# Patient Record
Sex: Female | Born: 1945 | Race: Black or African American | Hispanic: No | Marital: Married | Smoking: Never smoker
Health system: Southern US, Community
[De-identification: ages and names within clinical notes are randomized; demographics above are authoritative.]

## PROBLEM LIST (undated history)

## (undated) DIAGNOSIS — E079 Disorder of thyroid, unspecified: Secondary | ICD-10-CM

## (undated) DIAGNOSIS — C801 Malignant (primary) neoplasm, unspecified: Secondary | ICD-10-CM

## (undated) DIAGNOSIS — I10 Essential (primary) hypertension: Secondary | ICD-10-CM

## (undated) HISTORY — DX: Malignant (primary) neoplasm, unspecified: C80.1

---

## 1994-04-10 DIAGNOSIS — C801 Malignant (primary) neoplasm, unspecified: Secondary | ICD-10-CM

## 1994-04-10 HISTORY — DX: Malignant (primary) neoplasm, unspecified: C80.1

## 1994-04-10 HISTORY — PX: BREAST SURGERY: SHX581

## 1997-09-28 ENCOUNTER — Ambulatory Visit (HOSPITAL_COMMUNITY): Admission: RE | Admit: 1997-09-28 | Discharge: 1997-09-28 | Payer: Self-pay | Admitting: Specialist

## 1997-10-12 ENCOUNTER — Ambulatory Visit (HOSPITAL_BASED_OUTPATIENT_CLINIC_OR_DEPARTMENT_OTHER): Admission: RE | Admit: 1997-10-12 | Discharge: 1997-10-12 | Payer: Self-pay | Admitting: Specialist

## 1998-10-28 ENCOUNTER — Ambulatory Visit (HOSPITAL_BASED_OUTPATIENT_CLINIC_OR_DEPARTMENT_OTHER): Admission: RE | Admit: 1998-10-28 | Discharge: 1998-10-28 | Payer: Self-pay | Admitting: Specialist

## 2001-02-19 ENCOUNTER — Ambulatory Visit (HOSPITAL_COMMUNITY): Admission: RE | Admit: 2001-02-19 | Discharge: 2001-02-19 | Payer: Self-pay | Admitting: *Deleted

## 2001-02-19 ENCOUNTER — Encounter: Payer: Self-pay | Admitting: *Deleted

## 2001-07-23 ENCOUNTER — Other Ambulatory Visit: Admission: RE | Admit: 2001-07-23 | Discharge: 2001-07-23 | Payer: Self-pay | Admitting: *Deleted

## 2002-03-13 ENCOUNTER — Encounter: Payer: Self-pay | Admitting: *Deleted

## 2002-03-13 ENCOUNTER — Ambulatory Visit (HOSPITAL_COMMUNITY): Admission: RE | Admit: 2002-03-13 | Discharge: 2002-03-13 | Payer: Self-pay | Admitting: *Deleted

## 2003-03-17 ENCOUNTER — Ambulatory Visit (HOSPITAL_COMMUNITY): Admission: RE | Admit: 2003-03-17 | Discharge: 2003-03-17 | Payer: Self-pay | Admitting: *Deleted

## 2004-03-25 ENCOUNTER — Ambulatory Visit (HOSPITAL_COMMUNITY): Admission: RE | Admit: 2004-03-25 | Discharge: 2004-03-25 | Payer: Self-pay | Admitting: *Deleted

## 2004-11-07 ENCOUNTER — Ambulatory Visit (HOSPITAL_COMMUNITY): Admission: RE | Admit: 2004-11-07 | Discharge: 2004-11-07 | Payer: Self-pay | Admitting: Family Medicine

## 2004-11-17 ENCOUNTER — Encounter (HOSPITAL_COMMUNITY): Admission: RE | Admit: 2004-11-17 | Discharge: 2004-12-17 | Payer: Self-pay | Admitting: Family Medicine

## 2004-11-18 ENCOUNTER — Ambulatory Visit: Payer: Self-pay | Admitting: Oncology

## 2004-12-09 ENCOUNTER — Ambulatory Visit: Payer: Self-pay | Admitting: Oncology

## 2006-01-25 ENCOUNTER — Encounter (HOSPITAL_COMMUNITY): Admission: RE | Admit: 2006-01-25 | Discharge: 2006-02-24 | Payer: Self-pay | Admitting: Family Medicine

## 2006-01-29 ENCOUNTER — Ambulatory Visit (HOSPITAL_COMMUNITY): Admission: RE | Admit: 2006-01-29 | Discharge: 2006-01-29 | Payer: Self-pay | Admitting: Family Medicine

## 2006-02-12 ENCOUNTER — Ambulatory Visit (HOSPITAL_COMMUNITY): Admission: RE | Admit: 2006-02-12 | Discharge: 2006-02-12 | Payer: Self-pay | Admitting: General Surgery

## 2006-02-12 ENCOUNTER — Encounter (INDEPENDENT_AMBULATORY_CARE_PROVIDER_SITE_OTHER): Payer: Self-pay | Admitting: *Deleted

## 2006-05-24 ENCOUNTER — Ambulatory Visit (HOSPITAL_COMMUNITY): Admission: RE | Admit: 2006-05-24 | Discharge: 2006-05-24 | Payer: Self-pay | Admitting: Family Medicine

## 2006-07-19 ENCOUNTER — Encounter (HOSPITAL_COMMUNITY): Admission: RE | Admit: 2006-07-19 | Discharge: 2006-08-18 | Payer: Self-pay | Admitting: Family Medicine

## 2006-07-20 ENCOUNTER — Ambulatory Visit (HOSPITAL_COMMUNITY): Admission: RE | Admit: 2006-07-20 | Discharge: 2006-07-20 | Payer: Self-pay | Admitting: Family Medicine

## 2006-07-30 ENCOUNTER — Ambulatory Visit: Payer: Self-pay | Admitting: Oncology

## 2006-08-09 ENCOUNTER — Ambulatory Visit: Payer: Self-pay | Admitting: Oncology

## 2006-09-09 ENCOUNTER — Ambulatory Visit: Payer: Self-pay | Admitting: Oncology

## 2007-02-18 ENCOUNTER — Ambulatory Visit (HOSPITAL_COMMUNITY): Admission: RE | Admit: 2007-02-18 | Discharge: 2007-02-18 | Payer: Self-pay | Admitting: Family Medicine

## 2007-10-22 ENCOUNTER — Ambulatory Visit: Payer: Self-pay | Admitting: Gastroenterology

## 2007-10-30 ENCOUNTER — Ambulatory Visit (HOSPITAL_COMMUNITY): Admission: RE | Admit: 2007-10-30 | Discharge: 2007-10-30 | Payer: Self-pay | Admitting: Gastroenterology

## 2007-10-30 ENCOUNTER — Ambulatory Visit: Payer: Self-pay | Admitting: Gastroenterology

## 2007-10-30 ENCOUNTER — Encounter: Payer: Self-pay | Admitting: Gastroenterology

## 2008-02-19 ENCOUNTER — Ambulatory Visit (HOSPITAL_COMMUNITY): Admission: RE | Admit: 2008-02-19 | Discharge: 2008-02-19 | Payer: Self-pay | Admitting: Family Medicine

## 2008-02-25 ENCOUNTER — Emergency Department (HOSPITAL_COMMUNITY): Admission: EM | Admit: 2008-02-25 | Discharge: 2008-02-25 | Payer: Self-pay | Admitting: Emergency Medicine

## 2008-07-16 ENCOUNTER — Ambulatory Visit (HOSPITAL_COMMUNITY): Admission: RE | Admit: 2008-07-16 | Discharge: 2008-07-16 | Payer: Self-pay | Admitting: Family Medicine

## 2008-08-08 ENCOUNTER — Ambulatory Visit: Payer: Self-pay | Admitting: Oncology

## 2008-08-10 ENCOUNTER — Ambulatory Visit: Payer: Self-pay | Admitting: Oncology

## 2008-09-08 ENCOUNTER — Ambulatory Visit: Payer: Self-pay | Admitting: Oncology

## 2008-09-09 ENCOUNTER — Ambulatory Visit: Payer: Self-pay | Admitting: Oncology

## 2008-09-18 ENCOUNTER — Ambulatory Visit: Payer: Self-pay | Admitting: Oncology

## 2008-09-21 ENCOUNTER — Ambulatory Visit: Payer: Self-pay | Admitting: Oncology

## 2008-10-01 ENCOUNTER — Ambulatory Visit: Payer: Self-pay | Admitting: General Surgery

## 2008-10-07 ENCOUNTER — Inpatient Hospital Stay: Payer: Self-pay | Admitting: General Surgery

## 2008-10-08 ENCOUNTER — Ambulatory Visit: Payer: Self-pay | Admitting: Oncology

## 2008-10-23 ENCOUNTER — Ambulatory Visit: Payer: Self-pay | Admitting: Oncology

## 2008-11-08 ENCOUNTER — Ambulatory Visit: Payer: Self-pay | Admitting: Oncology

## 2008-11-16 ENCOUNTER — Ambulatory Visit (HOSPITAL_COMMUNITY): Admission: RE | Admit: 2008-11-16 | Discharge: 2008-11-16 | Payer: Self-pay | Admitting: Family Medicine

## 2008-12-09 ENCOUNTER — Ambulatory Visit: Payer: Self-pay | Admitting: Oncology

## 2009-01-08 ENCOUNTER — Ambulatory Visit: Payer: Self-pay | Admitting: Oncology

## 2009-02-08 ENCOUNTER — Ambulatory Visit: Payer: Self-pay | Admitting: Oncology

## 2009-02-24 ENCOUNTER — Ambulatory Visit (HOSPITAL_COMMUNITY): Admission: RE | Admit: 2009-02-24 | Discharge: 2009-02-24 | Payer: Self-pay | Admitting: Family Medicine

## 2009-03-01 ENCOUNTER — Ambulatory Visit: Payer: Self-pay | Admitting: Oncology

## 2009-03-10 ENCOUNTER — Ambulatory Visit: Payer: Self-pay | Admitting: Oncology

## 2009-04-10 ENCOUNTER — Ambulatory Visit: Payer: Self-pay | Admitting: Oncology

## 2009-05-11 ENCOUNTER — Ambulatory Visit: Payer: Self-pay | Admitting: Nurse Practitioner

## 2009-05-11 ENCOUNTER — Ambulatory Visit: Payer: Self-pay | Admitting: Oncology

## 2009-06-08 ENCOUNTER — Ambulatory Visit: Payer: Self-pay | Admitting: Nurse Practitioner

## 2009-06-08 ENCOUNTER — Ambulatory Visit: Payer: Self-pay | Admitting: Oncology

## 2009-06-30 ENCOUNTER — Ambulatory Visit (HOSPITAL_COMMUNITY)
Admission: RE | Admit: 2009-06-30 | Discharge: 2009-06-30 | Payer: Self-pay | Source: Home / Self Care | Admitting: Family Medicine

## 2009-07-09 ENCOUNTER — Ambulatory Visit: Payer: Self-pay | Admitting: Nurse Practitioner

## 2009-07-09 ENCOUNTER — Ambulatory Visit: Payer: Self-pay | Admitting: Oncology

## 2009-08-08 ENCOUNTER — Ambulatory Visit: Payer: Self-pay | Admitting: Nurse Practitioner

## 2009-08-08 ENCOUNTER — Ambulatory Visit: Payer: Self-pay | Admitting: Oncology

## 2009-09-08 ENCOUNTER — Ambulatory Visit: Payer: Self-pay | Admitting: Nurse Practitioner

## 2009-09-08 ENCOUNTER — Ambulatory Visit: Payer: Self-pay | Admitting: Oncology

## 2009-10-08 ENCOUNTER — Ambulatory Visit: Payer: Self-pay | Admitting: Nurse Practitioner

## 2009-10-08 ENCOUNTER — Ambulatory Visit: Payer: Self-pay | Admitting: Oncology

## 2009-11-02 LAB — CANCER ANTIGEN 27.29: CA 27.29: 172.7 U/mL — ABNORMAL HIGH (ref 0.0–38.6)

## 2009-11-08 ENCOUNTER — Ambulatory Visit: Payer: Self-pay | Admitting: Oncology

## 2009-11-29 ENCOUNTER — Ambulatory Visit: Payer: Self-pay | Admitting: Oncology

## 2009-12-01 LAB — CANCER ANTIGEN 27.29: CA 27.29: 176.7 U/mL — ABNORMAL HIGH (ref 0.0–38.6)

## 2009-12-09 ENCOUNTER — Ambulatory Visit: Payer: Self-pay | Admitting: Oncology

## 2010-01-08 ENCOUNTER — Ambulatory Visit: Payer: Self-pay | Admitting: Oncology

## 2010-02-08 ENCOUNTER — Ambulatory Visit: Payer: Self-pay | Admitting: Oncology

## 2010-03-10 ENCOUNTER — Ambulatory Visit: Payer: Self-pay | Admitting: Oncology

## 2010-03-22 ENCOUNTER — Ambulatory Visit (HOSPITAL_COMMUNITY)
Admission: RE | Admit: 2010-03-22 | Discharge: 2010-03-22 | Payer: Self-pay | Source: Home / Self Care | Attending: Oncology | Admitting: Oncology

## 2010-03-29 LAB — CANCER ANTIGEN 27.29: CA 27.29: 185.4 U/mL — ABNORMAL HIGH (ref 0.0–38.6)

## 2010-04-06 ENCOUNTER — Ambulatory Visit: Payer: Self-pay | Admitting: Oncology

## 2010-04-10 ENCOUNTER — Ambulatory Visit: Payer: Self-pay | Admitting: Oncology

## 2010-04-26 LAB — CANCER ANTIGEN 27.29: CA 27.29: 200.7 U/mL — ABNORMAL HIGH (ref 0.0–38.6)

## 2010-05-01 ENCOUNTER — Encounter: Payer: Self-pay | Admitting: Family Medicine

## 2010-05-11 ENCOUNTER — Ambulatory Visit: Payer: Self-pay | Admitting: Oncology

## 2010-06-09 ENCOUNTER — Ambulatory Visit: Payer: Self-pay | Admitting: Oncology

## 2010-07-10 ENCOUNTER — Ambulatory Visit: Payer: Self-pay | Admitting: Oncology

## 2010-08-09 ENCOUNTER — Ambulatory Visit: Payer: Self-pay | Admitting: Oncology

## 2010-08-23 NOTE — Consult Note (Signed)
NAMEESTEL, TONELLI              ACCOUNT NO.:  [id number]   MEDICAL RECORD NO.:  [id number]          PATIENT TYPE:  AMB   LOCATION:  DAY                           FACILITY:  [facility]   PHYSICIAN:  Kassie Mends, M.D.      DATE OF BIRTH:  09-10-1945   DATE OF CONSULTATION:  10/22/2007  DATE OF DISCHARGE:                                 CONSULTATION   REASON FOR CONSULTATION:  Abdominal pain, bloating, gas, and dark  stools.   PHYSICIAN REQUESTING CONSULTATION:  Melony Overly, PAC with Patrica Duel, MD   PHYSICIAN CONCERNING NOTE:  Kassie Mends, MD   HISTORY OF PRESENT ILLNESS:  Toni Harper is a very pleasant 65 year old  African American female, who presents today at the request of Melony Overly, [facility], with Dr. Patrica Duel, for further evaluation of abdominal  pain, bloating, gas, and dark stools.  The patient states over the last  couple of months, she has been having postprandial abdominal pain.  She  states that the pain is so severe, she typically avoid meals as much as  possible.  She notes that she fast all day long; however, she then  develops abdominal pain.  She states that it has been gas like pains.  She has a lot of rolling on her stomach.  Pain is generally in the upper  abdomen.  She also has heartburn and indigestion.  She has it worse at  nighttime, especially if she eats light.  She often can only lie down  because of severe pain.  She has tried antacids, over-the-counter Pepto-  Bismol.  She has recently started on Nexium for the past 2 weeks.  She  has had some modest improvement in her symptoms.  Her bowel movements  are regular.  No bright red blood per rectum.  She has never had an EGD  or colonoscopy.   CURRENT MEDICATIONS:  1. Levothyroxine 125 mcg daily.  2. Simvastatin 10 mg daily.  3. Nexium 40 mg daily.  4. Milk of magnesia p.r.n.  5. Antacids p.r.n.   ALLERGIES:  No known drug allergies   PAST MEDICAL HISTORY:  1. She had right breast cancer in  1995, status post mastectomy and      chemo and radiation therapy.  2. Hypercholesterolemia.  3. Gastroesophageal reflux disease.  4. She has thyroid surgery and is on oral supplements.  5. She had 2 cesarean sections.  6. Cholecystectomy back in 2007, did not have any gallstones at that      time.  7. Per Dr. Geanie Logan medical records, she had H. pylori and on      February 7, and was treated but the patient does not recall this.   FAMILY HISTORY:  Mother died at age 84 with cancer of the gallbladder.  Father died at age 45 with cirrhosis of liver.  No family history for  colorectal cancer.   SOCIAL HISTORY:  She is married.  She has 2 sons.  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  She is a  nonsmoker.  No  alcohol use.   REVIEW OF SYSTEMS:  See HPI for GI.  CONSTITUTIONAL:  No weight loss.  CARDIOPULMONARY:  No chest pain or shortness of breath.  GENITOURINARY:  No dysuria or hematuria.   PHYSICAL EXAMINATION:  VITAL SIGNS:  Weight 181, height 5 feet 6 inches,  temp 99.4, blood pressure 122/78, and pulse 80.  GENERAL:  Pleasant, well-nourished, and well-developed black female in  no acute distress.  SKIN: Warm and dry. No jaundice.  HEENT:  Sclera nonicteric.  Oropharyngeal mucosa moist and pink.  No  lesion, erythema, or exudates.  No lymphadenopathy or thyromegaly.  CHEST:  Lungs are clear to auscultation.  CARDIAC:  Regular rate and rhythm.  Normal S1 and S2.  No murmurs, rubs,  or gallops.  ABDOMEN:  Positive bowel sounds.  Abdomen soft and nondistended.  She  has mildly epigastric tenderness to deep palpation.  No rebound or  guarding.  No organomegaly or masses.  No abdominal bruits or hernia.  EXTREMITIES:  No edema.   IMPRESSION:  Ms. Greenspan is a pleasant 65 year old lady with a couple  of months of history postprandial upper abdominal pain associated with  bloating and gas, suspected dark stools due to Pepto-Bismol.  She also   has poorly controlled reflux with particularly not tunnel symptoms.  She  is on Nexium 2 weeks with some improvement of these symptoms.  She may  have gastritis, peptic ulcer disease.  She may simply have a complicated  gastroesophageal reflux disease.  She also has never had a colonoscopy.   PLAN:  1. EGD and colonoscopy by Dr. Cira Servant in the near future.  I discussed      risks, alternatives, and benefits with regards to the risks and      reactions of medication, perforation, infection, or bleeding and      she is in agreement of her procedures.  2. She will continue Nexium 40 mg daily as before.  3. Further recommendations to follow.   I would like to thank Dr. Marlan Palau for allowing [place] to take part in  the care of this patient.      Tana Coast, P.A.      Kassie Mends, M.D.  Electronically Signed    LL/MEDQ  D:  10/22/2007  T:  10/23/2007  Job:  [id number]   cc:   Patrica Duel, M.D.  Fax: [phone]   Kassie Mends, M.D.  [address]  [place] , [place] [address]

## 2010-08-23 NOTE — Op Note (Signed)
NAMEDELORA, Toni Harper              ACCOUNT NO.:  [id number]   MEDICAL RECORD NO.:  [id number]          PATIENT TYPE:  AMB   LOCATION:  DAY                           FACILITY:  [facility]   PHYSICIAN:  Kassie Mends, M.D.      DATE OF BIRTH:  1946/01/18   DATE OF PROCEDURE:  10/30/2007  DATE OF DISCHARGE:                               OPERATIVE REPORT   REFERRING PHYSICIAN:  Patrica Duel, MD   PROCEDURES:  1. Colonoscopy.  2. Esophagogastroduodenoscopy with cold forceps biopsy.   INDICATION FOR EXAM:  [name] is a 65 year old female who complains  of postprandial abdominal pain, the onset is immediate.  She denies any  nausea, vomiting, diarrhea, or constipation.  She does have gas.  She is  on Nexium as an outpatient.  She denies any rectal bleeding.  She  reportedly had a history of positive H. pylori serology, but denies  being treated.   FINDINGS:  1. Moderate internal hemorrhoids.  Otherwise, no polyps, masses,      inflammatory changes, diverticula, or AVMs.  2. Normal esophagus without evidence of Barrett's, mass, erosion,      ulceration, or stricture.  3. Patchy erythema in the body and antrum without erosion or      ulceration.  Biopsies obtained via cold forceps to evaluate for H.      pylori gastritis.  4. Normal duodenal bulb, ampulla, and second portion of the duodenum.   DIAGNOSIS:  [name] postprandial abdominal pain is likely  secondary to gastritis. The differential diagnosis includes a functional  gut disorder.   RECOMMENDATIONS:  1. Screening colonoscopy in 10 years.  2. Will call her with the results of her biopsies.  If her biopsy      showed no evidence of H. pylori gastritis and then would consider      treating based on her H. pylori serology and consider adding Levsin      prior to meals.  3. She should follow a high-fiber diet.  She is given a handout on      high-fiber diet, hemorrhoids, and gastritis.  4. Follow up appointment in 6 weeks  with Tana Coast regarding her      abdominal pain.   PROCEDURE TECHNIQUE:  Physical exam was performed.  Informed consent was  obtained from the patient after explaining the benefits, risks, and  alternatives to the procedure.  The patient was connected to the monitor  and placed in left lateral position.  Continuous oxygen was provided by  nasal cannula.  IV medicine was administered through an indwelling  cannula.  After administration of sedation and rectal exam, the  patient's rectum was intubated, and the scope was advanced under direct  visualization to the cecum.  The scope was removed slowly by carefully  examining the color, texture, anatomy, and integrity of the mucosa on  the way out.   After the colonoscopy, the patient's esophagus was intubated with a  diagnostic gastroscope.  The scope was advanced under direct  visualization to the second portion of the duodenum.  The scope was  removed slowly  by careful examining the color, texture, anatomy, and  integrity of the mucosa on the way out.  The patient was recovered in  Endoscopy and discharged home in satisfactory condition.   PATH:  H. pylori gastritis. ZO:XWRU/EAVWU for 7 days.      Kassie Mends, M.D.  Electronically Signed     SM/MEDQ  D:  10/30/2007  T:  10/30/2007  Job:  [id number]   cc:   Patrica Duel, M.D.  Fax: [phone]

## 2010-08-26 NOTE — H&P (Signed)
NAMETEAGEN, BUCIO NO.:  [id number]   MEDICAL RECORD NO.:  [id number]          PATIENT TYPE:  AMB   LOCATION:  DAY                           FACILITY:  [facility]   PHYSICIAN:  Dalia Heading, M.D.  DATE OF BIRTH:  28-Oct-1945   DATE OF ADMISSION:  02/12/2006  DATE OF DISCHARGE:  LH                                HISTORY & PHYSICAL   CHIEF COMPLAINT:  Cholecystitis, cholelithiasis.   HISTORY OF PRESENT ILLNESS:  The patient is a 65 year old black female who  is referred for evaluation and treatment of biliary colic secondary to  cholelithiasis.  She has been having right upper quadrant abdominal pain,  nausea, bloating for many weeks.  She does have fatty food intolerance.  No  fever, chills, or jaundice have been noted.   PAST MEDICAL HISTORY:  Hypothyroidism.   PAST SURGICAL HISTORY:  1. Right breast biopsy.  2. Thyroidectomy.  3. C-sections.   CURRENT MEDICATIONS:  Synthroid 1 tablet p.o. daily.   ALLERGIES:  No known drug allergies.   REVIEW OF SYSTEMS:  Noncontributory.   PHYSICAL EXAMINATION:  GENERAL:  The patient is a well-developed, well-  nourished black female in no acute distress.  HEENT:  Reveals no scleral icterus.  LUNGS:  Clear to auscultation, with equal breath sounds bilaterally.  HEART:  Reveals a regular rate and rhythm, without S3, S4, or murmurs.  ABDOMEN:  Soft and nondistended, with mild tenderness in the right upper  quadrant to palpation.  No hepatosplenomegaly, masses, or hernias are  identified.   Ultrasound of the gallbladder reveals cholelithiasis, with a normal common  bile duct.  HIDA scan reveals chronic cholecystitis, with a low gallbladder  ejection fraction.   IMPRESSION:  1. Cholecystitis.  2. Cholelithiasis.   PLAN:  The patient is scheduled for a laparoscopic cholecystectomy on  February 12, 2006.  The risks and benefits of the procedure, including  bleeding, infection, hepatobiliary injury, and the possibly of  an open  procedure were fully explained to the patient, who gave informed consent.      Dalia Heading, M.D.  Electronically Signed     MAJ/MEDQ  D:  02/08/2006  T:  02/08/2006  Job:  [id number]   cc:   Short Stay at [facility]   Patrica Duel, M.D.  Fax: [phone]

## 2010-08-26 NOTE — Op Note (Signed)
NAMEJOHNISHA, LOUKS NO.:  [id number]   MEDICAL RECORD NO.:  [id number]          PATIENT TYPE:  AMB   LOCATION:  DAY                           FACILITY:  [facility]   PHYSICIAN:  Dalia Heading, M.D.  DATE OF BIRTH:  11/14/1945   DATE OF PROCEDURE:  02/12/2006  DATE OF DISCHARGE:                                 OPERATIVE REPORT   PREOPERATIVE DIAGNOSIS:  Cholecystitis, cholelithiasis.   POSTOPERATIVE DIAGNOSIS:  Cholecystitis, cholelithiasis.   PROCEDURE:  Laparoscopic cholecystectomy.   SURGEON:  Dr. Franky Macho.   ASSISTANT:  Dr. Arna Snipe.   ANESTHESIA:  General endotracheal.   INDICATIONS:  The patient is a 65 year old black female presents with  cholecystitis secondary to cholelithiasis.  Risks and benefits of the  procedure including bleeding, infection, hepatobiliary injury, possibility  of an open procedure were fully explained to the patient, who gave informed  consent.   PROCEDURE NOTE:  The patient was placed in the supine position.  After  induction of general endotracheal anesthesia, the abdomen prepped and draped  in the usual sterile technique with Betadine.  Surgical site confirmation  was performed.   A supraumbilical incision was made down to the fascia.  Veress needle was  introduced into the abdominal cavity and confirmation of placement was done  using the saline drop test.  The abdomen was then insufflated to 16 mmHg  pressure.  11 mm trocar was introduced into the abdominal cavity under  direct visualization without difficulty.  The patient was placed in reversed  Trendelenburg position.  An additional 11-mm trocar was placed in the  epigastric region and 5-mm trocars placed in the right upper quadrant right  flank regions.  Liver was inspected and noted to be normal limits.  The  gallbladder was retracted superior laterally.  Dissection was begun around  the infundibulum of the gallbladder.  The cystic duct was first  identified.  Its juncture to the infundibulum fully identified.  Endo clips placed  proximally distally on cystic duct and cystic duct was divided.  This  likewise done to the cystic artery.  The gallbladder then freed away from  the gallbladder fossa using Bovie electrocautery.  The gallbladder delivered  through the epigastric trocar site using EndoCatch bag.  The gallbladder  fossa was inspected and no abnormal bleeding or bile leakage was noted.  Surgicel was placed in the gallbladder fossa.  All fluid and then evacuate  from the abdominal cavity prior to remove the trocars.   All wounds were irrigated normal saline.  All wounds were injected with 0.5  cm Sensorcaine.  The supraumbilical fascia was reapproximated using an O  Vicryl interrupted suture.  All skin incisions were closed using staples.  Betadine ointment and dry sterile dressings were applied.   All tape and needle counts correct end the procedure.  The patient was  extubated in the operating room went back to recovery room awake in stable  condition.   COMPLICATIONS:  None.   SPECIMEN:  Gallbladder with stones.   BLOOD LOSS:  Minimal.      Dalia Heading, M.D.  Electronically Signed     MAJ/MEDQ  D:  02/12/2006  T:  02/12/2006  Job:  [id number]   cc:   Patrica Duel, M.D.  Fax: [phone]

## 2010-09-09 ENCOUNTER — Ambulatory Visit: Payer: Self-pay | Admitting: Oncology

## 2010-10-09 ENCOUNTER — Ambulatory Visit: Payer: Self-pay | Admitting: Oncology

## 2010-10-11 LAB — CANCER ANTIGEN 27.29: CA 27.29: 205.4 U/mL — ABNORMAL HIGH (ref 0.0–38.6)

## 2010-11-09 ENCOUNTER — Ambulatory Visit: Payer: Self-pay | Admitting: Oncology

## 2010-12-10 ENCOUNTER — Ambulatory Visit: Payer: Self-pay | Admitting: Oncology

## 2011-01-09 ENCOUNTER — Ambulatory Visit: Payer: Self-pay | Admitting: Oncology

## 2011-02-09 ENCOUNTER — Ambulatory Visit: Payer: Self-pay | Admitting: Oncology

## 2011-03-11 ENCOUNTER — Ambulatory Visit: Payer: Self-pay | Admitting: Oncology

## 2011-04-11 ENCOUNTER — Ambulatory Visit: Payer: Self-pay | Admitting: Oncology

## 2011-05-12 ENCOUNTER — Other Ambulatory Visit (HOSPITAL_COMMUNITY): Payer: Self-pay | Admitting: Internal Medicine

## 2011-05-12 DIAGNOSIS — Z139 Encounter for screening, unspecified: Secondary | ICD-10-CM

## 2011-05-15 ENCOUNTER — Ambulatory Visit: Payer: Self-pay | Admitting: Oncology

## 2011-05-15 LAB — CBC CANCER CENTER
Basophil #: 0 x10 3/mm (ref 0.0–0.1)
Basophil %: 0 %
Eosinophil #: 0.2 x10 3/mm (ref 0.0–0.7)
Eosinophil %: 5.3 %
HGB: 11.1 g/dL — ABNORMAL LOW (ref 12.0–16.0)
Lymphocyte #: 0.7 x10 3/mm — ABNORMAL LOW (ref 1.0–3.6)
MCH: 28.9 pg (ref 26.0–34.0)
MCHC: 33.7 g/dL (ref 32.0–36.0)
MCV: 86 fL (ref 80–100)
Monocyte #: 0.2 x10 3/mm (ref 0.0–0.7)
Neutrophil %: 76.4 %
Platelet: 270 x10 3/mm (ref 150–440)
RDW: 13.7 % (ref 11.5–14.5)

## 2011-05-15 LAB — COMPREHENSIVE METABOLIC PANEL
Albumin: 4.1 g/dL (ref 3.4–5.0)
Alkaline Phosphatase: 88 U/L (ref 50–136)
Anion Gap: 11 (ref 7–16)
BUN: 14 mg/dL (ref 7–18)
Bilirubin,Total: 0.5 mg/dL (ref 0.2–1.0)
Calcium, Total: 8.9 mg/dL (ref 8.5–10.1)
Chloride: 99 mmol/L (ref 98–107)
Co2: 28 mmol/L (ref 21–32)
EGFR (African American): >60
Glucose: 111 mg/dL — ABNORMAL HIGH (ref 65–99)
Osmolality: 277 (ref 275–301)
Potassium: 3.6 mmol/L (ref 3.5–5.1)
Sodium: 138 mmol/L (ref 136–145)

## 2011-05-16 ENCOUNTER — Ambulatory Visit (HOSPITAL_COMMUNITY): Payer: Self-pay

## 2011-05-16 LAB — CANCER ANTIGEN 27.29: CA 27.29: 208.2 U/mL — ABNORMAL HIGH (ref 0.0–38.6)

## 2011-06-09 ENCOUNTER — Ambulatory Visit: Payer: Self-pay | Admitting: Oncology

## 2011-06-12 LAB — CBC CANCER CENTER
Basophil #: 0 x10 3/mm (ref 0.0–0.1)
Basophil %: 0.9 %
Eosinophil %: 5 %
Lymphocyte #: 1 x10 3/mm (ref 1.0–3.6)
Lymphocyte %: 22.6 %
Monocyte %: 9 %
Neutrophil #: 2.8 x10 3/mm (ref 1.4–6.5)
Platelet: 238 x10 3/mm (ref 150–440)
WBC: 4.5 x10 3/mm (ref 3.6–11.0)

## 2011-06-12 LAB — COMPREHENSIVE METABOLIC PANEL
Albumin: 3.5 g/dL (ref 3.4–5.0)
Alkaline Phosphatase: 86 U/L (ref 50–136)
Anion Gap: 13 (ref 7–16)
BUN: 22 mg/dL — ABNORMAL HIGH (ref 7–18)
Bilirubin,Total: 0.4 mg/dL (ref 0.2–1.0)
Chloride: 103 mmol/L (ref 98–107)
Creatinine: 1.04 mg/dL (ref 0.60–1.30)
EGFR (African American): >60
EGFR (Non-African Amer.): 57 — ABNORMAL LOW
Osmolality: 290 (ref 275–301)
SGPT (ALT): 33 U/L
Sodium: 141 mmol/L (ref 136–145)

## 2011-06-22 ENCOUNTER — Other Ambulatory Visit: Payer: Self-pay | Admitting: Internal Medicine

## 2011-06-22 ENCOUNTER — Other Ambulatory Visit (HOSPITAL_COMMUNITY): Payer: Self-pay | Admitting: Oncology

## 2011-06-22 DIAGNOSIS — Z139 Encounter for screening, unspecified: Secondary | ICD-10-CM

## 2011-06-26 ENCOUNTER — Ambulatory Visit (HOSPITAL_COMMUNITY)
Admission: RE | Admit: 2011-06-26 | Discharge: 2011-06-26 | Disposition: A | Discharge status: Home / Self Care | Payer: Managed Care, Other (non HMO) | Source: Ambulatory Visit | Attending: Oncology | Admitting: Oncology

## 2011-06-26 DIAGNOSIS — Z1231 Encounter for screening mammogram for malignant neoplasm of breast: Secondary | ICD-10-CM | POA: Insufficient documentation

## 2011-06-26 DIAGNOSIS — Z139 Encounter for screening, unspecified: Secondary | ICD-10-CM

## 2011-07-10 ENCOUNTER — Ambulatory Visit: Payer: Self-pay | Admitting: Oncology

## 2011-07-10 LAB — COMPREHENSIVE METABOLIC PANEL
Alkaline Phosphatase: 73 U/L (ref 50–136)
Calcium, Total: 9.1 mg/dL (ref 8.5–10.1)
Co2: 28 mmol/L (ref 21–32)
Creatinine: 0.91 mg/dL (ref 0.60–1.30)
Glucose: 155 mg/dL — ABNORMAL HIGH (ref 65–99)
Potassium: 3.3 mmol/L — ABNORMAL LOW (ref 3.5–5.1)
Sodium: 143 mmol/L (ref 136–145)
Total Protein: 9.2 g/dL — ABNORMAL HIGH (ref 6.4–8.2)

## 2011-07-10 LAB — CBC CANCER CENTER
Basophil %: 1.2 %
Eosinophil %: 3.3 %
HCT: 31.3 % — ABNORMAL LOW (ref 35.0–47.0)
Lymphocyte %: 20.9 %
MCHC: 33.7 g/dL (ref 32.0–36.0)
MCV: 86 fL (ref 80–100)
Monocyte %: 7.1 %
Neutrophil #: 3.7 x10 3/mm (ref 1.4–6.5)
Neutrophil %: 67.5 %
Platelet: 231 x10 3/mm (ref 150–440)
RBC: 3.65 10*6/uL — ABNORMAL LOW (ref 3.80–5.20)

## 2011-07-11 LAB — CANCER ANTIGEN 27.29: CA 27.29: 207.8 U/mL — ABNORMAL HIGH (ref 0.0–38.6)

## 2011-08-09 ENCOUNTER — Ambulatory Visit: Payer: Self-pay | Admitting: Oncology

## 2011-08-14 LAB — COMPREHENSIVE METABOLIC PANEL
Anion Gap: 13 (ref 7–16)
Bilirubin,Total: 0.5 mg/dL (ref 0.2–1.0)
Calcium, Total: 8.8 mg/dL (ref 8.5–10.1)
Co2: 26 mmol/L (ref 21–32)
Creatinine: 1 mg/dL (ref 0.60–1.30)
EGFR (African American): >60
EGFR (Non-African Amer.): 60 — ABNORMAL LOW
Osmolality: 286 (ref 275–301)
Potassium: 3.4 mmol/L — ABNORMAL LOW (ref 3.5–5.1)
SGOT(AST): 24 U/L (ref 15–37)
SGPT (ALT): 26 U/L
Sodium: 141 mmol/L (ref 136–145)
Total Protein: 8.2 g/dL (ref 6.4–8.2)

## 2011-08-14 LAB — CBC CANCER CENTER
Basophil #: 0.1 x10 3/mm (ref 0.0–0.1)
Eosinophil #: 0.3 x10 3/mm (ref 0.0–0.7)
Eosinophil %: 4.9 %
HCT: 35.8 % (ref 35.0–47.0)
Lymphocyte #: 1.3 x10 3/mm (ref 1.0–3.6)
Lymphocyte %: 23.2 %
MCH: 28.5 pg (ref 26.0–34.0)
Monocyte #: 0.5 x10 3/mm (ref 0.2–0.9)
Neutrophil %: 61.5 %
Platelet: 197 x10 3/mm (ref 150–440)
WBC: 5.8 x10 3/mm (ref 3.6–11.0)

## 2011-08-15 LAB — CANCER ANTIGEN 27.29: CA 27.29: 211.2 U/mL — ABNORMAL HIGH (ref 0.0–38.6)

## 2011-09-09 ENCOUNTER — Ambulatory Visit: Payer: Self-pay | Admitting: Oncology

## 2011-09-11 LAB — COMPREHENSIVE METABOLIC PANEL
Alkaline Phosphatase: 92 U/L (ref 50–136)
Anion Gap: 12 (ref 7–16)
BUN: 20 mg/dL — ABNORMAL HIGH (ref 7–18)
Calcium, Total: 9.3 mg/dL (ref 8.5–10.1)
Chloride: 103 mmol/L (ref 98–107)
Creatinine: 0.96 mg/dL (ref 0.60–1.30)
EGFR (African American): >60
Osmolality: 282 (ref 275–301)
Potassium: 3.5 mmol/L (ref 3.5–5.1)
SGOT(AST): 28 U/L (ref 15–37)
Sodium: 140 mmol/L (ref 136–145)
Total Protein: 9.3 g/dL — ABNORMAL HIGH (ref 6.4–8.2)

## 2011-09-11 LAB — CBC CANCER CENTER
Basophil #: 0.1 x10 3/mm (ref 0.0–0.1)
Basophil %: 1.1 %
Eosinophil #: 0.4 x10 3/mm (ref 0.0–0.7)
Eosinophil %: 6.4 %
HCT: 32.5 % — ABNORMAL LOW (ref 35.0–47.0)
Lymphocyte %: 23.4 %
MCH: 28.3 pg (ref 26.0–34.0)
MCHC: 32.6 g/dL (ref 32.0–36.0)
MCV: 87 fL (ref 80–100)
Platelet: 250 x10 3/mm (ref 150–440)
RDW: 13.5 % (ref 11.5–14.5)

## 2011-09-12 LAB — CANCER ANTIGEN 27.29: CA 27.29: 183.5 U/mL — ABNORMAL HIGH (ref 0.0–38.6)

## 2011-10-09 ENCOUNTER — Ambulatory Visit: Payer: Self-pay | Admitting: Oncology

## 2011-10-09 LAB — COMPREHENSIVE METABOLIC PANEL
Alkaline Phosphatase: 82 U/L (ref 50–136)
Anion Gap: 10 (ref 7–16)
BUN: 12 mg/dL (ref 7–18)
Chloride: 106 mmol/L (ref 98–107)
Creatinine: 0.81 mg/dL (ref 0.60–1.30)
EGFR (Non-African Amer.): >60
Glucose: 130 mg/dL — ABNORMAL HIGH (ref 65–99)
Osmolality: 286 (ref 275–301)
SGOT(AST): 19 U/L (ref 15–37)
SGPT (ALT): 23 U/L
Total Protein: 8.5 g/dL — ABNORMAL HIGH (ref 6.4–8.2)

## 2011-10-09 LAB — CBC CANCER CENTER
Basophil #: 0.1 x10 3/mm (ref 0.0–0.1)
Eosinophil #: 0.3 x10 3/mm (ref 0.0–0.7)
Eosinophil %: 6.3 %
HCT: 34 % — ABNORMAL LOW (ref 35.0–47.0)
HGB: 11 g/dL — ABNORMAL LOW (ref 12.0–16.0)
MCH: 28.5 pg (ref 26.0–34.0)
MCHC: 32.5 g/dL (ref 32.0–36.0)
MCV: 88 fL (ref 80–100)
Monocyte %: 9.1 %
Neutrophil %: 57.3 %
Platelet: 245 x10 3/mm (ref 150–440)
RBC: 3.87 10*6/uL (ref 3.80–5.20)
WBC: 4.8 x10 3/mm (ref 3.6–11.0)

## 2011-11-06 LAB — CBC CANCER CENTER
Basophil #: 0.1 x10 3/mm (ref 0.0–0.1)
Basophil %: 1.2 %
Eosinophil #: 0.5 x10 3/mm (ref 0.0–0.7)
Lymphocyte #: 1.3 x10 3/mm (ref 1.0–3.6)
MCH: 29 pg (ref 26.0–34.0)
MCHC: 33.3 g/dL (ref 32.0–36.0)
MCV: 87 fL (ref 80–100)
Monocyte #: 0.4 x10 3/mm (ref 0.2–0.9)
Neutrophil #: 3.1 x10 3/mm (ref 1.4–6.5)
Neutrophil %: 56.8 %
Platelet: 266 x10 3/mm (ref 150–440)
RBC: 3.85 10*6/uL (ref 3.80–5.20)
RDW: 13.4 % (ref 11.5–14.5)

## 2011-11-06 LAB — COMPREHENSIVE METABOLIC PANEL
Anion Gap: 12 (ref 7–16)
BUN: 17 mg/dL (ref 7–18)
Calcium, Total: 8.9 mg/dL (ref 8.5–10.1)
Chloride: 104 mmol/L (ref 98–107)
Co2: 25 mmol/L (ref 21–32)
Potassium: 3.9 mmol/L (ref 3.5–5.1)
SGOT(AST): 25 U/L (ref 15–37)
SGPT (ALT): 27 U/L
Total Protein: 8.8 g/dL — ABNORMAL HIGH (ref 6.4–8.2)

## 2011-11-09 ENCOUNTER — Ambulatory Visit: Payer: Self-pay | Admitting: Oncology

## 2011-12-04 LAB — CBC CANCER CENTER
Basophil #: 0 x10 3/mm (ref 0.0–0.1)
Basophil %: 0.6 %
Eosinophil #: 0.4 x10 3/mm (ref 0.0–0.7)
Eosinophil %: 8.4 %
HCT: 33.5 % — ABNORMAL LOW (ref 35.0–47.0)
HGB: 10.9 g/dL — ABNORMAL LOW (ref 12.0–16.0)
Lymphocyte #: 1.2 x10 3/mm (ref 1.0–3.6)
Lymphocyte %: 22.6 %
MCHC: 32.5 g/dL (ref 32.0–36.0)
MCV: 86 fL (ref 80–100)
Monocyte %: 14.1 %
Neutrophil #: 2.8 x10 3/mm (ref 1.4–6.5)
Neutrophil %: 54.3 %
RBC: 3.89 10*6/uL (ref 3.80–5.20)
WBC: 5.2 x10 3/mm (ref 3.6–11.0)

## 2011-12-04 LAB — BASIC METABOLIC PANEL
Anion Gap: 11 (ref 7–16)
BUN: 18 mg/dL (ref 7–18)
Chloride: 103 mmol/L (ref 98–107)
Creatinine: 1.04 mg/dL (ref 0.60–1.30)
EGFR (Non-African Amer.): 57 — ABNORMAL LOW
Potassium: 3.7 mmol/L (ref 3.5–5.1)

## 2011-12-05 LAB — CANCER ANTIGEN 27.29: CA 27.29: 196 U/mL — ABNORMAL HIGH (ref 0.0–38.6)

## 2011-12-10 ENCOUNTER — Ambulatory Visit: Payer: Self-pay | Admitting: Oncology

## 2012-01-01 LAB — BASIC METABOLIC PANEL
Calcium, Total: 8.9 mg/dL (ref 8.5–10.1)
Creatinine: 0.8 mg/dL (ref 0.60–1.30)
EGFR (African American): >60
EGFR (Non-African Amer.): >60
Glucose: 101 mg/dL — ABNORMAL HIGH (ref 65–99)
Potassium: 3.5 mmol/L (ref 3.5–5.1)
Sodium: 138 mmol/L (ref 136–145)

## 2012-01-09 ENCOUNTER — Ambulatory Visit: Payer: Self-pay | Admitting: Oncology

## 2012-01-29 LAB — COMPREHENSIVE METABOLIC PANEL
Alkaline Phosphatase: 86 U/L (ref 50–136)
Anion Gap: 11 (ref 7–16)
Bilirubin,Total: 0.5 mg/dL (ref 0.2–1.0)
Calcium, Total: 8.9 mg/dL (ref 8.5–10.1)
Chloride: 104 mmol/L (ref 98–107)
Co2: 25 mmol/L (ref 21–32)
EGFR (African American): >60
EGFR (Non-African Amer.): >60
Potassium: 3.6 mmol/L (ref 3.5–5.1)
SGPT (ALT): 29 U/L (ref 12–78)
Sodium: 140 mmol/L (ref 136–145)

## 2012-01-29 LAB — CBC CANCER CENTER
Basophil #: 0.1 x10 3/mm (ref 0.0–0.1)
Eosinophil #: 0.3 x10 3/mm (ref 0.0–0.7)
Eosinophil %: 5.7 %
Lymphocyte #: 1.3 x10 3/mm (ref 1.0–3.6)
Lymphocyte %: 21 %
MCH: 27.6 pg (ref 26.0–34.0)
MCV: 86 fL (ref 80–100)
Monocyte %: 9.1 %
Neutrophil %: 61.9 %
Platelet: 256 x10 3/mm (ref 150–440)
RBC: 4.16 10*6/uL (ref 3.80–5.20)
RDW: 13.7 % (ref 11.5–14.5)
WBC: 6 x10 3/mm (ref 3.6–11.0)

## 2012-02-09 ENCOUNTER — Ambulatory Visit: Payer: Self-pay | Admitting: Oncology

## 2012-02-26 LAB — CBC CANCER CENTER
Eosinophil #: 0.1 x10 3/mm (ref 0.0–0.7)
Eosinophil %: 0.7 %
HCT: 34.4 % — ABNORMAL LOW (ref 35.0–47.0)
HGB: 11 g/dL — ABNORMAL LOW (ref 12.0–16.0)
Lymphocyte #: 0.9 x10 3/mm — ABNORMAL LOW (ref 1.0–3.6)
Lymphocyte %: 11.7 %
MCH: 27.7 pg (ref 26.0–34.0)
MCHC: 31.9 g/dL — ABNORMAL LOW (ref 32.0–36.0)
MCV: 87 fL (ref 80–100)
Monocyte %: 3.3 %
Neutrophil #: 6.5 x10 3/mm (ref 1.4–6.5)
Platelet: 280 x10 3/mm (ref 150–440)
RBC: 3.96 10*6/uL (ref 3.80–5.20)
RDW: 14.1 % (ref 11.5–14.5)

## 2012-02-26 LAB — COMPREHENSIVE METABOLIC PANEL
Albumin: 3.5 g/dL (ref 3.4–5.0)
Anion Gap: 15 (ref 7–16)
Bilirubin,Total: 0.3 mg/dL (ref 0.2–1.0)
Calcium, Total: 8.8 mg/dL (ref 8.5–10.1)
Chloride: 101 mmol/L (ref 98–107)
Creatinine: 1.08 mg/dL (ref 0.60–1.30)
EGFR (Non-African Amer.): 55 — ABNORMAL LOW
Glucose: 248 mg/dL — ABNORMAL HIGH (ref 65–99)
Osmolality: 286 (ref 275–301)
Potassium: 3.5 mmol/L (ref 3.5–5.1)
SGOT(AST): 19 U/L (ref 15–37)
Sodium: 137 mmol/L (ref 136–145)

## 2012-03-10 ENCOUNTER — Ambulatory Visit: Payer: Self-pay | Admitting: Oncology

## 2012-03-20 ENCOUNTER — Ambulatory Visit: Payer: Self-pay | Admitting: Oncology

## 2012-03-25 LAB — COMPREHENSIVE METABOLIC PANEL
Albumin: 3.5 g/dL (ref 3.4–5.0)
Alkaline Phosphatase: 85 U/L (ref 50–136)
BUN: 16 mg/dL (ref 7–18)
Chloride: 103 mmol/L (ref 98–107)
Co2: 25 mmol/L (ref 21–32)
Creatinine: 0.84 mg/dL (ref 0.60–1.30)
EGFR (African American): >60
EGFR (Non-African Amer.): >60
Glucose: 110 mg/dL — ABNORMAL HIGH (ref 65–99)
Osmolality: 283 (ref 275–301)
SGOT(AST): 23 U/L (ref 15–37)
SGPT (ALT): 27 U/L (ref 12–78)
Sodium: 141 mmol/L (ref 136–145)

## 2012-03-25 LAB — CBC CANCER CENTER
Comment - H1-Com2: NORMAL
HCT: 32 % — ABNORMAL LOW (ref 35.0–47.0)
HGB: 10.9 g/dL — ABNORMAL LOW (ref 12.0–16.0)
Lymphocytes: 21 %
MCHC: 33.9 g/dL (ref 32.0–36.0)
Platelet: 265 x10 3/mm (ref 150–440)
RDW: 13.7 % (ref 11.5–14.5)
Segmented Neutrophils: 66 %
WBC: 7.2 x10 3/mm (ref 3.6–11.0)

## 2012-04-10 ENCOUNTER — Ambulatory Visit: Payer: Self-pay | Admitting: Oncology

## 2012-04-17 LAB — COMPREHENSIVE METABOLIC PANEL
Albumin: 3.7 g/dL (ref 3.4–5.0)
Alkaline Phosphatase: 98 U/L (ref 50–136)
Anion Gap: 10 (ref 7–16)
BUN: 12 mg/dL (ref 7–18)
Bilirubin,Total: 0.5 mg/dL (ref 0.2–1.0)
Co2: 25 mmol/L (ref 21–32)
EGFR (African American): >60
Glucose: 121 mg/dL — ABNORMAL HIGH (ref 65–99)
Potassium: 3.9 mmol/L (ref 3.5–5.1)
SGOT(AST): 22 U/L (ref 15–37)
SGPT (ALT): 23 U/L (ref 12–78)
Sodium: 140 mmol/L (ref 136–145)
Total Protein: 9 g/dL — ABNORMAL HIGH (ref 6.4–8.2)

## 2012-04-17 LAB — CBC CANCER CENTER
Basophil #: 0.1 x10 3/mm (ref 0.0–0.1)
Basophil %: 0.8 %
Eosinophil #: 0.3 x10 3/mm (ref 0.0–0.7)
HGB: 11.5 g/dL — ABNORMAL LOW (ref 12.0–16.0)
Monocyte #: 0.6 x10 3/mm (ref 0.2–0.9)
Monocyte %: 7.3 %
Neutrophil #: 5.7 x10 3/mm (ref 1.4–6.5)
Neutrophil %: 73 %
RBC: 3.95 10*6/uL (ref 3.80–5.20)

## 2012-04-24 LAB — COMPREHENSIVE METABOLIC PANEL
Creatinine: 0.83 mg/dL (ref 0.60–1.30)
EGFR (African American): >60
EGFR (Non-African Amer.): >60
Glucose: 139 mg/dL — ABNORMAL HIGH (ref 65–99)
Potassium: 3.2 mmol/L — ABNORMAL LOW (ref 3.5–5.1)
SGPT (ALT): 45 U/L (ref 12–78)
Sodium: 138 mmol/L (ref 136–145)

## 2012-04-24 LAB — CBC CANCER CENTER
Basophil #: 0.1 x10 3/mm (ref 0.0–0.1)
Basophil %: 1 %
Eosinophil #: 0.3 x10 3/mm (ref 0.0–0.7)
Eosinophil %: 5.4 %
Lymphocyte %: 19.4 %
MCH: 30.6 pg (ref 26.0–34.0)
MCV: 92 fL (ref 80–100)
Monocyte #: 0.5 x10 3/mm (ref 0.2–0.9)
Monocyte %: 7.9 %
Neutrophil #: 4.1 x10 3/mm (ref 1.4–6.5)
Platelet: 262 x10 3/mm (ref 150–440)
RBC: 3.98 10*6/uL (ref 3.80–5.20)
WBC: 6.2 x10 3/mm (ref 3.6–11.0)

## 2012-05-01 LAB — CBC CANCER CENTER
Basophil %: 1.5 %
Eosinophil %: 0.4 %
HCT: 35.1 % (ref 35.0–47.0)
HGB: 11.9 g/dL — ABNORMAL LOW (ref 12.0–16.0)
MCH: 30.6 pg (ref 26.0–34.0)
MCHC: 33.9 g/dL (ref 32.0–36.0)
Monocyte #: 0.5 x10 3/mm (ref 0.2–0.9)
Neutrophil #: 2 x10 3/mm (ref 1.4–6.5)
Platelet: 215 x10 3/mm (ref 150–440)
RBC: 3.88 10*6/uL (ref 3.80–5.20)
RDW: 18.1 % — ABNORMAL HIGH (ref 11.5–14.5)
WBC: 3.7 x10 3/mm (ref 3.6–11.0)

## 2012-05-08 LAB — CBC CANCER CENTER
Basophil #: 0 x10 3/mm (ref 0.0–0.1)
Basophil %: 1.4 %
Eosinophil #: 0 x10 3/mm (ref 0.0–0.7)
Eosinophil %: 0 %
HCT: 36.1 % (ref 35.0–47.0)
HGB: 12.3 g/dL (ref 12.0–16.0)
Lymphocyte #: 1.2 x10 3/mm (ref 1.0–3.6)
Lymphocyte %: 46.3 %
MCHC: 33.9 g/dL (ref 32.0–36.0)
MCV: 92 fL (ref 80–100)
Monocyte #: 0.4 x10 3/mm (ref 0.2–0.9)
RBC: 3.93 10*6/uL (ref 3.80–5.20)
RDW: 18.1 % — ABNORMAL HIGH (ref 11.5–14.5)
WBC: 2.7 x10 3/mm — ABNORMAL LOW (ref 3.6–11.0)

## 2012-05-11 ENCOUNTER — Ambulatory Visit: Payer: Self-pay | Admitting: Oncology

## 2012-05-15 LAB — CBC CANCER CENTER
Basophil #: 0.1 x10 3/mm (ref 0.0–0.1)
Basophil %: 1.1 %
Eosinophil #: 0 x10 3/mm (ref 0.0–0.7)
Eosinophil %: 0.2 %
HCT: 36.2 % (ref 35.0–47.0)
HGB: 12.2 g/dL (ref 12.0–16.0)
Lymphocyte #: 1.6 x10 3/mm (ref 1.0–3.6)
Lymphocyte %: 23.6 %
MCH: 31.4 pg (ref 26.0–34.0)
MCHC: 33.7 g/dL (ref 32.0–36.0)
MCV: 93 fL (ref 80–100)
Monocyte #: 1.1 x10 3/mm — ABNORMAL HIGH (ref 0.2–0.9)
Monocyte %: 15.9 %
Neutrophil #: 4 x10 3/mm (ref 1.4–6.5)
Neutrophil %: 59.2 %
Platelet: 297 x10 3/mm (ref 150–440)
RBC: 3.88 10*6/uL (ref 3.80–5.20)
RDW: 18.9 % — ABNORMAL HIGH (ref 11.5–14.5)
WBC: 6.8 x10 3/mm (ref 3.6–11.0)

## 2012-05-15 LAB — BASIC METABOLIC PANEL
BUN: 13 mg/dL (ref 7–18)
Calcium, Total: 8.7 mg/dL (ref 8.5–10.1)
Creatinine: 0.8 mg/dL (ref 0.60–1.30)
EGFR (African American): >60
EGFR (Non-African Amer.): >60
Glucose: 101 mg/dL — ABNORMAL HIGH (ref 65–99)
Osmolality: 285 (ref 275–301)
Potassium: 3.3 mmol/L — ABNORMAL LOW (ref 3.5–5.1)
Sodium: 143 mmol/L (ref 136–145)

## 2012-05-22 LAB — COMPREHENSIVE METABOLIC PANEL
Albumin: 3.3 g/dL — ABNORMAL LOW (ref 3.4–5.0)
Anion Gap: 9 (ref 7–16)
BUN: 6 mg/dL — ABNORMAL LOW (ref 7–18)
Bilirubin,Total: 0.8 mg/dL (ref 0.2–1.0)
Chloride: 101 mmol/L (ref 98–107)
Co2: 30 mmol/L (ref 21–32)
Creatinine: 0.79 mg/dL (ref 0.60–1.30)
EGFR (African American): >60
Osmolality: 280 (ref 275–301)
Potassium: 3.2 mmol/L — ABNORMAL LOW (ref 3.5–5.1)
SGOT(AST): 44 U/L — ABNORMAL HIGH (ref 15–37)
Sodium: 140 mmol/L (ref 136–145)
Total Protein: 7.8 g/dL (ref 6.4–8.2)

## 2012-05-22 LAB — CBC CANCER CENTER
Basophil #: 0.1 x10 3/mm (ref 0.0–0.1)
Basophil %: 1.8 %
Eosinophil #: 0 x10 3/mm (ref 0.0–0.7)
HCT: 35.4 % (ref 35.0–47.0)
Lymphocyte #: 1.2 x10 3/mm (ref 1.0–3.6)
Lymphocyte %: 36.4 %
MCH: 31.6 pg (ref 26.0–34.0)
MCHC: 34 g/dL (ref 32.0–36.0)
Monocyte #: 0.4 x10 3/mm (ref 0.2–0.9)
Neutrophil #: 1.7 x10 3/mm (ref 1.4–6.5)
Platelet: 217 x10 3/mm (ref 150–440)
RDW: 18 % — ABNORMAL HIGH (ref 11.5–14.5)

## 2012-06-05 LAB — CBC CANCER CENTER
Eosinophil #: 0 x10 3/mm (ref 0.0–0.7)
Eosinophil %: 0.1 %
HCT: 35.9 % (ref 35.0–47.0)
HGB: 12 g/dL (ref 12.0–16.0)
Lymphocyte #: 1.7 x10 3/mm (ref 1.0–3.6)
Lymphocyte %: 23.8 %
MCH: 32 pg (ref 26.0–34.0)
MCHC: 33.3 g/dL (ref 32.0–36.0)
MCV: 96 fL (ref 80–100)
Monocyte #: 1 x10 3/mm — ABNORMAL HIGH (ref 0.2–0.9)
Neutrophil #: 4.3 x10 3/mm (ref 1.4–6.5)
Neutrophil %: 61 %
Platelet: 298 x10 3/mm (ref 150–440)
RBC: 3.73 10*6/uL — ABNORMAL LOW (ref 3.80–5.20)
RDW: 18.5 % — ABNORMAL HIGH (ref 11.5–14.5)

## 2012-06-05 LAB — COMPREHENSIVE METABOLIC PANEL
Alkaline Phosphatase: 89 U/L (ref 50–136)
Anion Gap: 12 (ref 7–16)
BUN: 12 mg/dL (ref 7–18)
Chloride: 105 mmol/L (ref 98–107)
Co2: 25 mmol/L (ref 21–32)
EGFR (Non-African Amer.): >60
Osmolality: 286 (ref 275–301)
SGOT(AST): 18 U/L (ref 15–37)
SGPT (ALT): 27 U/L (ref 12–78)
Total Protein: 8.1 g/dL (ref 6.4–8.2)

## 2012-06-08 ENCOUNTER — Ambulatory Visit: Payer: Self-pay | Admitting: Oncology

## 2012-06-12 LAB — CBC CANCER CENTER
Basophil #: 0.1 x10 3/mm (ref 0.0–0.1)
Basophil %: 1.3 %
Eosinophil #: 0 x10 3/mm (ref 0.0–0.7)
HGB: 12.1 g/dL (ref 12.0–16.0)
Lymphocyte #: 1.3 x10 3/mm (ref 1.0–3.6)
Lymphocyte %: 32.6 %
MCH: 32.6 pg (ref 26.0–34.0)
MCV: 96 fL (ref 80–100)
Monocyte #: 0.3 x10 3/mm (ref 0.2–0.9)
Neutrophil #: 2.4 x10 3/mm (ref 1.4–6.5)
Neutrophil %: 57.5 %
Platelet: 283 x10 3/mm (ref 150–440)
RBC: 3.71 10*6/uL — ABNORMAL LOW (ref 3.80–5.20)
RDW: 17.6 % — ABNORMAL HIGH (ref 11.5–14.5)

## 2012-06-12 LAB — COMPREHENSIVE METABOLIC PANEL
Albumin: 3.6 g/dL (ref 3.4–5.0)
Anion Gap: 5 — ABNORMAL LOW (ref 7–16)
BUN: 11 mg/dL (ref 7–18)
Calcium, Total: 9.1 mg/dL (ref 8.5–10.1)
Chloride: 102 mmol/L (ref 98–107)
Co2: 28 mmol/L (ref 21–32)
EGFR (African American): >60
Glucose: 175 mg/dL — ABNORMAL HIGH (ref 65–99)
Osmolality: 274 (ref 275–301)
Potassium: 3.7 mmol/L (ref 3.5–5.1)
SGOT(AST): 27 U/L (ref 15–37)
SGPT (ALT): 35 U/L (ref 12–78)
Sodium: 135 mmol/L — ABNORMAL LOW (ref 136–145)

## 2012-06-13 LAB — CANCER ANTIGEN 27.29: CA 27.29: 261 U/mL — ABNORMAL HIGH (ref 0.0–38.6)

## 2012-06-19 LAB — CBC CANCER CENTER
Basophil %: 1.3 %
Eosinophil #: 0 x10 3/mm (ref 0.0–0.7)
Eosinophil %: 0 %
HCT: 34.4 % — ABNORMAL LOW (ref 35.0–47.0)
HGB: 11.6 g/dL — ABNORMAL LOW (ref 12.0–16.0)
Lymphocyte #: 1.2 x10 3/mm (ref 1.0–3.6)
MCH: 32.3 pg (ref 26.0–34.0)
MCHC: 33.6 g/dL (ref 32.0–36.0)
MCV: 96 fL (ref 80–100)
Monocyte #: 0.5 x10 3/mm (ref 0.2–0.9)
Monocyte %: 20 %
Neutrophil #: 0.8 x10 3/mm — ABNORMAL LOW (ref 1.4–6.5)
Neutrophil %: 32.5 %
Platelet: 293 x10 3/mm (ref 150–440)
RBC: 3.58 10*6/uL — ABNORMAL LOW (ref 3.80–5.20)
WBC: 2.6 x10 3/mm — ABNORMAL LOW (ref 3.6–11.0)

## 2012-06-26 LAB — COMPREHENSIVE METABOLIC PANEL
Albumin: 3.2 g/dL — ABNORMAL LOW (ref 3.4–5.0)
Alkaline Phosphatase: 76 U/L (ref 50–136)
BUN: 12 mg/dL (ref 7–18)
Calcium, Total: 8.4 mg/dL — ABNORMAL LOW (ref 8.5–10.1)
Chloride: 100 mmol/L (ref 98–107)
Co2: 27 mmol/L (ref 21–32)
Creatinine: 0.92 mg/dL (ref 0.60–1.30)
EGFR (African American): >60
EGFR (Non-African Amer.): >60
Osmolality: 276 (ref 275–301)
SGOT(AST): 16 U/L (ref 15–37)
SGPT (ALT): 22 U/L (ref 12–78)
Total Protein: 7.9 g/dL (ref 6.4–8.2)

## 2012-06-26 LAB — CBC CANCER CENTER
Basophil %: 1.2 %
Eosinophil %: 0 %
HCT: 34.3 % — ABNORMAL LOW (ref 35.0–47.0)
HGB: 11.6 g/dL — ABNORMAL LOW (ref 12.0–16.0)
Lymphocyte %: 24.2 %
MCHC: 33.7 g/dL (ref 32.0–36.0)
MCV: 98 fL (ref 80–100)
Monocyte #: 1 x10 3/mm — ABNORMAL HIGH (ref 0.2–0.9)
Neutrophil #: 3.6 x10 3/mm (ref 1.4–6.5)
RBC: 3.5 10*6/uL — ABNORMAL LOW (ref 3.80–5.20)
WBC: 6.2 x10 3/mm (ref 3.6–11.0)

## 2012-07-03 LAB — CBC CANCER CENTER
Basophil #: 0 x10 3/mm (ref 0.0–0.1)
Basophil %: 1 %
Eosinophil #: 0 x10 3/mm (ref 0.0–0.7)
HCT: 34.8 % — ABNORMAL LOW (ref 35.0–47.0)
HGB: 12 g/dL (ref 12.0–16.0)
Lymphocyte %: 33.1 %
MCV: 96 fL (ref 80–100)
Monocyte #: 0.6 x10 3/mm (ref 0.2–0.9)
Monocyte %: 13.4 %
Neutrophil %: 52.3 %
Platelet: 237 x10 3/mm (ref 150–440)
WBC: 4.2 x10 3/mm (ref 3.6–11.0)

## 2012-07-09 ENCOUNTER — Ambulatory Visit: Payer: Self-pay | Admitting: Oncology

## 2012-07-09 LAB — CBC CANCER CENTER
Basophil #: 0 x10 3/mm (ref 0.0–0.1)
Basophil %: 0.4 %
Eosinophil #: 0.1 x10 3/mm (ref 0.0–0.7)
Eosinophil %: 1.8 %
HGB: 11.6 g/dL — ABNORMAL LOW (ref 12.0–16.0)
Lymphocyte %: 33.8 %
MCHC: 32.9 g/dL (ref 32.0–36.0)
MCV: 98 fL (ref 80–100)
Monocyte #: 0.9 x10 3/mm (ref 0.2–0.9)
Neutrophil %: 36 %
Platelet: 326 x10 3/mm (ref 150–440)
RDW: 16.6 % — ABNORMAL HIGH (ref 11.5–14.5)
WBC: 3.1 x10 3/mm — ABNORMAL LOW (ref 3.6–11.0)

## 2012-07-17 LAB — CBC CANCER CENTER
Basophil #: 0.1 x10 3/mm (ref 0.0–0.1)
Basophil %: 1.1 %
Eosinophil #: 0 x10 3/mm (ref 0.0–0.7)
Eosinophil %: 0.6 %
HGB: 11.5 g/dL — ABNORMAL LOW (ref 12.0–16.0)
Lymphocyte #: 1.5 x10 3/mm (ref 1.0–3.6)
MCH: 31.3 pg (ref 26.0–34.0)
MCHC: 32.1 g/dL (ref 32.0–36.0)
Monocyte %: 11.8 %
Neutrophil #: 4.1 x10 3/mm (ref 1.4–6.5)
Neutrophil %: 63 %
Platelet: 326 x10 3/mm (ref 150–440)
RBC: 3.67 10*6/uL — ABNORMAL LOW (ref 3.80–5.20)

## 2012-07-17 LAB — COMPREHENSIVE METABOLIC PANEL
Albumin: 3.4 g/dL (ref 3.4–5.0)
Alkaline Phosphatase: 83 U/L (ref 50–136)
Anion Gap: 8 (ref 7–16)
BUN: 14 mg/dL (ref 7–18)
Bilirubin,Total: 0.6 mg/dL (ref 0.2–1.0)
Calcium, Total: 8.6 mg/dL (ref 8.5–10.1)
Chloride: 99 mmol/L (ref 98–107)
Co2: 29 mmol/L (ref 21–32)
Creatinine: 0.99 mg/dL (ref 0.60–1.30)
EGFR (Non-African Amer.): >60
Glucose: 125 mg/dL — ABNORMAL HIGH (ref 65–99)
Potassium: 3.9 mmol/L (ref 3.5–5.1)
Total Protein: 8.4 g/dL — ABNORMAL HIGH (ref 6.4–8.2)

## 2012-07-24 LAB — COMPREHENSIVE METABOLIC PANEL
Albumin: 3.2 g/dL — ABNORMAL LOW (ref 3.4–5.0)
Alkaline Phosphatase: 81 U/L (ref 50–136)
Anion Gap: 8 (ref 7–16)
BUN: 12 mg/dL (ref 7–18)
Bilirubin,Total: 1.5 mg/dL — ABNORMAL HIGH (ref 0.2–1.0)
Calcium, Total: 8.9 mg/dL (ref 8.5–10.1)
Chloride: 96 mmol/L — ABNORMAL LOW (ref 98–107)
Co2: 28 mmol/L (ref 21–32)
EGFR (Non-African Amer.): 58 — ABNORMAL LOW
Osmolality: 267 (ref 275–301)
Potassium: 3.5 mmol/L (ref 3.5–5.1)
SGOT(AST): 36 U/L (ref 15–37)
SGPT (ALT): 33 U/L (ref 12–78)
Total Protein: 8.4 g/dL — ABNORMAL HIGH (ref 6.4–8.2)

## 2012-07-24 LAB — CBC CANCER CENTER
Basophil %: 0.5 %
Eosinophil #: 0 x10 3/mm (ref 0.0–0.7)
Eosinophil %: 0 %
HCT: 34.5 % — ABNORMAL LOW (ref 35.0–47.0)
HGB: 11.7 g/dL — ABNORMAL LOW (ref 12.0–16.0)
Lymphocyte #: 1.1 x10 3/mm (ref 1.0–3.6)
Lymphocyte %: 16.3 %
MCHC: 33.9 g/dL (ref 32.0–36.0)
MCV: 95 fL (ref 80–100)
Neutrophil #: 4.6 x10 3/mm (ref 1.4–6.5)
RBC: 3.62 10*6/uL — ABNORMAL LOW (ref 3.80–5.20)

## 2012-07-29 ENCOUNTER — Encounter: Payer: Self-pay | Admitting: *Deleted

## 2012-07-29 LAB — COMPREHENSIVE METABOLIC PANEL
Anion Gap: 9 (ref 7–16)
Bilirubin,Total: 0.3 mg/dL (ref 0.2–1.0)
Calcium, Total: 9.2 mg/dL (ref 8.5–10.1)
Chloride: 100 mmol/L (ref 98–107)
Co2: 27 mmol/L (ref 21–32)
Creatinine: 0.81 mg/dL (ref 0.60–1.30)
EGFR (Non-African Amer.): >60
Potassium: 3.7 mmol/L (ref 3.5–5.1)
SGPT (ALT): 88 U/L — ABNORMAL HIGH (ref 12–78)
Total Protein: 8.3 g/dL — ABNORMAL HIGH (ref 6.4–8.2)

## 2012-07-29 LAB — CBC CANCER CENTER
Basophil #: 0.1 x10 3/mm (ref 0.0–0.1)
Basophil %: 1.2 %
Eosinophil #: 0 x10 3/mm (ref 0.0–0.7)
Eosinophil %: 0.8 %
HCT: 32.2 % — ABNORMAL LOW (ref 35.0–47.0)
HGB: 10.7 g/dL — ABNORMAL LOW (ref 12.0–16.0)
Lymphocyte #: 1.1 x10 3/mm (ref 1.0–3.6)
Monocyte #: 1.5 x10 3/mm — ABNORMAL HIGH (ref 0.2–0.9)
Monocyte %: 31.9 %
Neutrophil #: 2.1 x10 3/mm (ref 1.4–6.5)
Neutrophil %: 42.6 %
Platelet: 376 x10 3/mm (ref 150–440)
RBC: 3.36 10*6/uL — ABNORMAL LOW (ref 3.80–5.20)
WBC: 4.8 x10 3/mm (ref 3.6–11.0)

## 2012-07-30 ENCOUNTER — Ambulatory Visit (INDEPENDENT_AMBULATORY_CARE_PROVIDER_SITE_OTHER): Payer: Private Health Insurance - Indemnity | Admitting: General Surgery

## 2012-07-30 ENCOUNTER — Encounter: Payer: Self-pay | Admitting: General Surgery

## 2012-07-30 VITALS — BP 132/72 | HR 80 | Resp 12 | Ht 65.0 in | Wt 158.0 lb

## 2012-07-30 DIAGNOSIS — Z853 Personal history of malignant neoplasm of breast: Secondary | ICD-10-CM | POA: Insufficient documentation

## 2012-07-30 LAB — CULTURE, BLOOD (SINGLE)

## 2012-07-30 NOTE — Progress Notes (Signed)
Patient ID: Toni Harper, female   DOB: 1945/08/08, 67 y.o.   MRN: [id number]  Chief Complaint  Patient presents with  . Follow-up    right breast prosthetic infection    HPI Toni Harper is a 67 y.o. female.  Patient here today for right breast evaluation referred by Dr Doylene Canning.  Patient states she had a sinus infection about a week ago and now she may have an infection at her right breast prothesis area.  I was swollen and sore but it seems to be improving with [facility]. She has known history of right breast cancer 1996 and in 2010 was diagnosed with diffuse left lung metastatic breast carcinoma, is currently being treated for lung cancer. HPI  Past Medical History  Diagnosis Date  . Cancer 1996    breast    Past Surgical History  Procedure Laterality Date  . Breast surgery Right 1996    mastectomy    History reviewed. No pertinent family history.  Social History History  Substance Use Topics  . Smoking status: Never Smoker   . Smokeless tobacco: Never Used  . Alcohol Use: No    No Known Allergies  Current Outpatient Prescriptions  Medication Sig Dispense Refill  . Alum & Mag Hydroxide-Simeth (MAGIC MOUTHWASH) SOLN Take by mouth.      . dexamethasone (DECADRON) 4 MG tablet Take 4 mg by mouth as needed (after chemo).      Marland Kitchen levothyroxine (SYNTHROID, LEVOTHROID) 150 MCG tablet Take 150 mcg by mouth daily before breakfast.      . ondansetron (ZOFRAN-ODT) 4 MG disintegrating tablet Take 4 mg by mouth every 8 (eight) hours as needed for nausea.      . potassium chloride SA (K-DUR,KLOR-CON) 20 MEQ tablet Take 20 mEq by mouth 2 (two) times daily.      . promethazine (PHENERGAN) 25 MG tablet Take 25 mg by mouth every 6 (six) hours as needed for nausea.      . traMADol (ULTRAM) 50 MG tablet Take 50 mg by mouth every 6 (six) hours as needed for pain.       No current facility-administered medications for this visit.    Review of Systems Review  of Systems  Constitutional: Negative.   Respiratory: Negative.   Cardiovascular: Negative.     Blood pressure 132/72, pulse 80, resp. rate 12, height 5\' 5"  (1.651 m), weight 158 lb (71.668 kg).  Physical Exam Physical Exam  Constitutional: She appears well-developed and well-nourished.  Neck: Normal range of motion. Neck supple.  Cardiovascular: Normal rate, regular rhythm and normal heart sounds.   Pulmonary/Chest: Effort normal and breath sounds normal. Left breast exhibits no inverted nipple, no mass, no nipple discharge, no skin change and no tenderness.  Mild Skin edema on right breast.  The breast is replaced with an implant that is very tense and immobile. No redness or skin  Induration.  Lymphadenopathy:    She has no cervical adenopathy.    She has no axillary adenopathy.    Data Reviewed No imaging at this point  Assessment    Today's exam failed to reveal any signs of infection involving the right breast prosthesis. I did discuss with Dr. Doylene Canning who noted redness and induration consistent with infection several days ago. It appears the infection has resolved.     Plan    No further recommendations at this time.       SANKAR,SEEPLAPUTHUR G 07/30/2012, 6:27 PM

## 2012-07-30 NOTE — Patient Instructions (Addendum)
Patient to return as needed. Will discuss with Dr. Koleen Nimrod . No signs of infection

## 2012-08-05 LAB — CBC CANCER CENTER
Basophil #: 0.1 "x10 3/mm "
Basophil %: 1.3 %
Eosinophil #: 0 "x10 3/mm "
Eosinophil %: 0.4 %
HCT: 32.4 % — ABNORMAL LOW
HGB: 10.4 g/dL — ABNORMAL LOW
Lymphocyte %: 18.2 %
Lymphs Abs: 1.7 "x10 3/mm "
MCH: 30.6 pg
MCHC: 32.1 g/dL
MCV: 95 fL
Monocyte #: 0.8 "x10 3/mm "
Monocyte %: 9.1 %
Neutrophil #: 6.4 "x10 3/mm "
Neutrophil %: 71 %
Platelet: 490 "x10 3/mm " — ABNORMAL HIGH
RBC: 3.4 "x10 6/mm " — ABNORMAL LOW
RDW: 16.5 % — ABNORMAL HIGH
WBC: 9 "x10 3/mm "

## 2012-08-05 LAB — COMPREHENSIVE METABOLIC PANEL
Alkaline Phosphatase: 107 U/L (ref 50–136)
Anion Gap: 9 (ref 7–16)
Chloride: 100 mmol/L (ref 98–107)
Co2: 29 mmol/L (ref 21–32)
Creatinine: 0.76 mg/dL (ref 0.60–1.30)
EGFR (Non-African Amer.): >60
Glucose: 99 mg/dL (ref 65–99)
Osmolality: 279 (ref 275–301)
SGPT (ALT): 35 U/L (ref 12–78)
Total Protein: 8.9 g/dL — ABNORMAL HIGH (ref 6.4–8.2)

## 2012-08-08 ENCOUNTER — Ambulatory Visit: Payer: Self-pay | Admitting: Oncology

## 2012-08-12 LAB — COMPREHENSIVE METABOLIC PANEL
Albumin: 3.2 g/dL — ABNORMAL LOW (ref 3.4–5.0)
Anion Gap: 10 (ref 7–16)
Calcium, Total: 9.1 mg/dL (ref 8.5–10.1)
Chloride: 100 mmol/L (ref 98–107)
Co2: 29 mmol/L (ref 21–32)
EGFR (African American): >60
Glucose: 128 mg/dL — ABNORMAL HIGH (ref 65–99)
Osmolality: 280 (ref 275–301)
Potassium: 3.7 mmol/L (ref 3.5–5.1)
SGOT(AST): 34 U/L (ref 15–37)
Sodium: 139 mmol/L (ref 136–145)
Total Protein: 8.5 g/dL — ABNORMAL HIGH (ref 6.4–8.2)

## 2012-08-12 LAB — CBC CANCER CENTER
Eosinophil %: 0.4 %
Lymphocyte %: 38 %
MCH: 31.6 pg (ref 26.0–34.0)
Monocyte %: 7.3 %
Neutrophil #: 2.1 x10 3/mm (ref 1.4–6.5)
Neutrophil %: 53.2 %
Platelet: 351 x10 3/mm (ref 150–440)
RDW: 16.7 % — ABNORMAL HIGH (ref 11.5–14.5)

## 2012-08-26 ENCOUNTER — Other Ambulatory Visit (HOSPITAL_COMMUNITY): Payer: Self-pay | Admitting: Family Medicine

## 2012-08-26 ENCOUNTER — Ambulatory Visit (HOSPITAL_COMMUNITY)
Admission: RE | Admit: 2012-08-26 | Discharge: 2012-08-26 | Disposition: A | Discharge status: Home / Self Care | Payer: Managed Care, Other (non HMO) | Source: Ambulatory Visit | Attending: Family Medicine | Admitting: Family Medicine

## 2012-08-26 DIAGNOSIS — M19019 Primary osteoarthritis, unspecified shoulder: Secondary | ICD-10-CM

## 2012-08-26 DIAGNOSIS — S4350XA Sprain of unspecified acromioclavicular joint, initial encounter: Secondary | ICD-10-CM

## 2012-08-26 DIAGNOSIS — M25511 Pain in right shoulder: Secondary | ICD-10-CM

## 2012-08-26 DIAGNOSIS — M25519 Pain in unspecified shoulder: Secondary | ICD-10-CM | POA: Insufficient documentation

## 2012-08-28 LAB — COMPREHENSIVE METABOLIC PANEL
Albumin: 2.8 g/dL — ABNORMAL LOW (ref 3.4–5.0)
Alkaline Phosphatase: 112 U/L (ref 50–136)
EGFR (African American): >60
Glucose: 145 mg/dL — ABNORMAL HIGH (ref 65–99)
Osmolality: 277 (ref 275–301)
Sodium: 137 mmol/L (ref 136–145)

## 2012-08-28 LAB — CBC CANCER CENTER
Basophil #: 0.1 x10 3/mm (ref 0.0–0.1)
Eosinophil %: 0.2 %
HCT: 32.9 % — ABNORMAL LOW (ref 35.0–47.0)
HGB: 10.9 g/dL — ABNORMAL LOW (ref 12.0–16.0)
Lymphocyte %: 19.2 %
Monocyte #: 1.2 x10 3/mm — ABNORMAL HIGH (ref 0.2–0.9)
Monocyte %: 13.2 %
Neutrophil #: 6.1 x10 3/mm (ref 1.4–6.5)
Platelet: 348 x10 3/mm (ref 150–440)
RBC: 3.46 10*6/uL — ABNORMAL LOW (ref 3.80–5.20)
WBC: 9.2 x10 3/mm (ref 3.6–11.0)

## 2012-09-04 LAB — CBC CANCER CENTER
Basophil #: 0 x10 3/mm (ref 0.0–0.1)
Basophil %: 0.7 %
Eosinophil #: 0 x10 3/mm (ref 0.0–0.7)
Eosinophil %: 0.2 %
HGB: 10.6 g/dL — ABNORMAL LOW (ref 12.0–16.0)
Lymphocyte #: 1.3 x10 3/mm (ref 1.0–3.6)
Lymphocyte %: 40.1 %
MCH: 31 pg (ref 26.0–34.0)
MCV: 93 fL (ref 80–100)
Monocyte #: 0.3 x10 3/mm (ref 0.2–0.9)
Neutrophil #: 1.6 x10 3/mm (ref 1.4–6.5)
Neutrophil %: 49 %
Platelet: 340 x10 3/mm (ref 150–440)
RBC: 3.41 10*6/uL — ABNORMAL LOW (ref 3.80–5.20)
RDW: 17.1 % — ABNORMAL HIGH (ref 11.5–14.5)
WBC: 3.3 x10 3/mm — ABNORMAL LOW (ref 3.6–11.0)

## 2012-09-08 ENCOUNTER — Ambulatory Visit: Payer: Self-pay | Admitting: Oncology

## 2012-09-11 ENCOUNTER — Ambulatory Visit: Payer: Self-pay | Admitting: Oncology

## 2012-09-11 LAB — CBC CANCER CENTER
Basophil #: 0 x10 3/mm (ref 0.0–0.1)
Eosinophil #: 0 x10 3/mm (ref 0.0–0.7)
HCT: 31.2 % — ABNORMAL LOW (ref 35.0–47.0)
HGB: 10.3 g/dL — ABNORMAL LOW (ref 12.0–16.0)
Lymphocyte #: 1.2 x10 3/mm (ref 1.0–3.6)
MCH: 30.9 pg (ref 26.0–34.0)
Monocyte %: 14.5 %
RDW: 18 % — ABNORMAL HIGH (ref 11.5–14.5)
WBC: 2.4 x10 3/mm — ABNORMAL LOW (ref 3.6–11.0)

## 2012-09-18 LAB — COMPREHENSIVE METABOLIC PANEL
Alkaline Phosphatase: 85 U/L (ref 50–136)
Anion Gap: 5 — ABNORMAL LOW (ref 7–16)
Chloride: 106 mmol/L (ref 98–107)
Creatinine: 0.72 mg/dL (ref 0.60–1.30)
EGFR (African American): >60
Glucose: 107 mg/dL — ABNORMAL HIGH (ref 65–99)
Osmolality: 281 (ref 275–301)
Sodium: 141 mmol/L (ref 136–145)
Total Protein: 8.2 g/dL (ref 6.4–8.2)

## 2012-09-18 LAB — CBC CANCER CENTER
Basophil #: 0.1 x10 3/mm (ref 0.0–0.1)
Basophil %: 1.4 %
Eosinophil #: 0 x10 3/mm (ref 0.0–0.7)
Eosinophil %: 0.1 %
HCT: 32.3 % — ABNORMAL LOW (ref 35.0–47.0)
HGB: 10.5 g/dL — ABNORMAL LOW (ref 12.0–16.0)
MCHC: 32.5 g/dL (ref 32.0–36.0)
MCV: 96 fL (ref 80–100)
Monocyte %: 15.9 %
Neutrophil %: 55.1 %
Platelet: 379 x10 3/mm (ref 150–440)
RDW: 19.7 % — ABNORMAL HIGH (ref 11.5–14.5)
WBC: 5.8 x10 3/mm (ref 3.6–11.0)

## 2012-10-08 ENCOUNTER — Ambulatory Visit: Payer: Self-pay | Admitting: Oncology

## 2012-10-14 LAB — CBC CANCER CENTER
Basophil #: 0 "x10 3/mm "
Basophil %: 1 %
Eosinophil #: 0.2 "x10 3/mm "
Eosinophil %: 3.5 %
HCT: 32.7 % — ABNORMAL LOW
HGB: 10.9 g/dL — ABNORMAL LOW
Lymphocyte %: 20.1 %
Lymphs Abs: 0.9 "x10 3/mm " — ABNORMAL LOW
MCH: 32 pg
MCHC: 33.4 g/dL
MCV: 96 fL
Monocyte #: 0.4 "x10 3/mm "
Monocyte %: 8.5 %
Neutrophil #: 3.1 "x10 3/mm "
Neutrophil %: 66.9 %
Platelet: 285 "x10 3/mm "
RBC: 3.41 "x10 6/mm " — ABNORMAL LOW
RDW: 17.5 % — ABNORMAL HIGH
WBC: 4.6 "x10 3/mm "

## 2012-10-14 LAB — COMPREHENSIVE METABOLIC PANEL WITH GFR
Albumin: 3.4 g/dL
Alkaline Phosphatase: 79 U/L
Anion Gap: 9
BUN: 14 mg/dL
Bilirubin,Total: 0.7 mg/dL
Calcium, Total: 9.8 mg/dL
Chloride: 101 mmol/L
Co2: 29 mmol/L
Creatinine: 0.77 mg/dL
EGFR (African American): >60
EGFR (Non-African Amer.): >60
Glucose: 96 mg/dL
Osmolality: 278
Potassium: 3.5 mmol/L
SGOT(AST): 18 U/L
SGPT (ALT): 17 U/L
Sodium: 139 mmol/L
Total Protein: 8.9 g/dL — ABNORMAL HIGH

## 2012-10-18 ENCOUNTER — Encounter (HOSPITAL_COMMUNITY): Payer: Self-pay | Admitting: *Deleted

## 2012-10-18 ENCOUNTER — Emergency Department (HOSPITAL_COMMUNITY)
Admission: EM | Admit: 2012-10-18 | Discharge: 2012-10-19 | Disposition: A | Discharge status: Home / Self Care | Payer: Managed Care, Other (non HMO) | Attending: Emergency Medicine | Admitting: Emergency Medicine

## 2012-10-18 DIAGNOSIS — E079 Disorder of thyroid, unspecified: Secondary | ICD-10-CM | POA: Insufficient documentation

## 2012-10-18 DIAGNOSIS — Z853 Personal history of malignant neoplasm of breast: Secondary | ICD-10-CM | POA: Insufficient documentation

## 2012-10-18 DIAGNOSIS — R062 Wheezing: Secondary | ICD-10-CM | POA: Insufficient documentation

## 2012-10-18 DIAGNOSIS — I10 Essential (primary) hypertension: Secondary | ICD-10-CM | POA: Insufficient documentation

## 2012-10-18 DIAGNOSIS — Z79899 Other long term (current) drug therapy: Secondary | ICD-10-CM | POA: Insufficient documentation

## 2012-10-18 DIAGNOSIS — R0602 Shortness of breath: Secondary | ICD-10-CM | POA: Insufficient documentation

## 2012-10-18 HISTORY — DX: Disorder of thyroid, unspecified: E07.9

## 2012-10-18 HISTORY — DX: Essential (primary) hypertension: I10

## 2012-10-18 MED ORDER — ALBUTEROL SULFATE (5 MG/ML) 0.5% IN NEBU
2.5000 mg | INHALATION_SOLUTION | Freq: Once | RESPIRATORY_TRACT | Status: AC
  Administered 2012-10-19: 2.5 mg via RESPIRATORY_TRACT
  Filled 2012-10-18: qty 0.5

## 2012-10-18 MED ORDER — IPRATROPIUM BROMIDE 0.02 % IN SOLN
0.5000 mg | Freq: Once | RESPIRATORY_TRACT | Status: AC
  Administered 2012-10-19: 0.5 mg via RESPIRATORY_TRACT
  Filled 2012-10-18: qty 2.5

## 2012-10-18 NOTE — ED Notes (Signed)
Pt reports waking up with SOB about 30 minutes ago.  Pt reports current diagnosis of lung cancer, no respiratory medications being taken.

## 2012-10-19 MED ORDER — IPRATROPIUM BROMIDE 0.02 % IN SOLN
0.5000 mg | Freq: Once | RESPIRATORY_TRACT | Status: AC
  Administered 2012-10-19: 0.5 mg via RESPIRATORY_TRACT
  Filled 2012-10-19: qty 2.5

## 2012-10-19 MED ORDER — ALBUTEROL SULFATE (5 MG/ML) 0.5% IN NEBU
2.5000 mg | INHALATION_SOLUTION | Freq: Once | RESPIRATORY_TRACT | Status: AC
  Administered 2012-10-19: 2.5 mg via RESPIRATORY_TRACT
  Filled 2012-10-19: qty 0.5

## 2012-10-19 NOTE — ED Provider Notes (Signed)
History    CSN: [id number] Arrival date & time 10/18/12  2337  First MD Initiated Contact with Patient 10/19/12 0017     Chief Complaint  Patient presents with  . Shortness of Breath   (Consider location/radiation/quality/duration/timing/severity/associated sxs/prior Treatment) HPI HPI Comments: Toni Harper is a 67 y.o. female with a h/o metastatic breast cancer, inoperable, who presents to the Emergency Department complaining of shortness of breath that has been present since Monday and is getting worse. She was seen by her oncologist on Monday and stated on prednisone. Wheezing continues. Denies fever, chills, nausea, vomiting.   PCP Dr. Phillips Odor Oncologist is in [place]  Past Medical History  Diagnosis Date  . Cancer 1996    breast  . Hypertension   . Thyroid disease    Past Surgical History  Procedure Laterality Date  . Breast surgery Right 1996    mastectomy   History reviewed. No pertinent family history. History  Substance Use Topics  . Smoking status: Never Smoker   . Smokeless tobacco: Never Used  . Alcohol Use: No   OB History   Grav Para Term Preterm Abortions TAB SAB Ect Mult Living   2 2        2      Obstetric Comments   1st Menstrual Cycle: 14 1st Pregnancy:     Review of Systems  Constitutional: Negative for fever.       10 Systems reviewed and are negative for acute change except as noted in the HPI.  HENT: Negative for congestion.   Eyes: Negative for discharge and redness.  Respiratory: Positive for shortness of breath and wheezing. Negative for cough.   Cardiovascular: Negative for chest pain.  Gastrointestinal: Negative for vomiting and abdominal pain.  Musculoskeletal: Negative for back pain.  Skin: Negative for rash.  Neurological: Negative for syncope, numbness and headaches.  Psychiatric/Behavioral:       No behavior change.    Allergies  Review of patient's allergies indicates no known allergies.  Home Medications    Current Outpatient Rx  Name  Route  Sig  Dispense  Refill  . Alum & Mag Hydroxide-Simeth (MAGIC MOUTHWASH) SOLN   Oral   Take by mouth.         . dexamethasone (DECADRON) 4 MG tablet   Oral   Take 4 mg by mouth as needed (after chemo).         Marland Kitchen levothyroxine (SYNTHROID, LEVOTHROID) 150 MCG tablet   Oral   Take 150 mcg by mouth daily before breakfast.         . ondansetron (ZOFRAN-ODT) 4 MG disintegrating tablet   Oral   Take 4 mg by mouth every 8 (eight) hours as needed for nausea.         . potassium chloride SA (K-DUR,KLOR-CON) 20 MEQ tablet   Oral   Take 20 mEq by mouth 2 (two) times daily.         . promethazine (PHENERGAN) 25 MG tablet   Oral   Take 25 mg by mouth every 6 (six) hours as needed for nausea.         . traMADol (ULTRAM) 50 MG tablet   Oral   Take 50 mg by mouth every 6 (six) hours as needed for pain.          BP 185/84  Pulse 114  Temp(Src) 98.2 F (36.8 C) (Oral)  Resp 22  Ht 5\' 5"  (1.651 m)  Wt 155 lb (70.308 kg)  BMI  25.79 kg/m2  SpO2 99% Physical Exam  Nursing note and vitals reviewed. Constitutional: She appears well-developed and well-nourished.  Awake, alert, nontoxic appearance.  HENT:  Head: Normocephalic and atraumatic.  Eyes: EOM are normal. Pupils are equal, round, and reactive to light.  Neck: Neck supple.  Cardiovascular: Normal rate and intact distal pulses.   Pulmonary/Chest: Effort normal. She has wheezes. She exhibits no tenderness.  Abdominal: Soft. There is no tenderness. There is no rebound.  Musculoskeletal: She exhibits no tenderness.  Baseline ROM, no obvious new focal weakness.  Neurological:  Mental status and motor strength appears baseline for patient and situation.  Skin: No rash noted.  Psychiatric: She has a normal mood and affect.    ED Course  Procedures (including critical care time)  0217 Patient has had two albuterol/atrofent treatments with improvement. Wheezing is better. Patient  feels ready to go home.  MDM  Patient with metastatic breast cancer here with shortness of breath. She received albuterol/atrovent x 2 with improvement. She continues on prednisone. She will see her doctor on Monday. Pt stable in ED with no significant deterioration in condition.The patient appears reasonably screened and/or stabilized for discharge and I doubt any other medical condition or other [facility] requiring further screening, evaluation, or treatment in the ED at this time prior to discharge.  Pt stable in ED with no significant deterioration in condition.The patient appears reasonably screened and/or stabilized for discharge and I doubt any other medical condition or other [facility] requiring further screening, evaluation, or treatment in the ED at this time prior to discharge.  MDM Reviewed: nursing note and vitals     Nicoletta Dress. Colon Branch, MD 10/19/12 [phone]

## 2012-10-19 NOTE — ED Notes (Signed)
Pt removed from oxygen.

## 2012-10-21 LAB — PROTIME-INR
INR: 1.1
Prothrombin Time: 14.6 secs (ref 11.5–14.7)

## 2012-10-21 LAB — APTT: Activated PTT: 27.7 secs (ref 23.6–35.9)

## 2012-10-25 ENCOUNTER — Ambulatory Visit: Payer: Self-pay | Admitting: Oncology

## 2012-10-28 LAB — CBC CANCER CENTER
Basophil #: 0.1 "x10 3/mm "
Basophil %: 1 %
Eosinophil #: 0 "x10 3/mm "
Eosinophil %: 0.1 %
HCT: 36.3 %
HGB: 12.1 g/dL
Lymphocyte %: 9.2 %
Lymphs Abs: 0.7 "x10 3/mm " — ABNORMAL LOW
MCH: 33.6 pg
MCHC: 33.4 g/dL
MCV: 101 fL — ABNORMAL HIGH
Monocyte #: 0.1 "x10 3/mm " — ABNORMAL LOW
Monocyte %: 1.6 %
Neutrophil #: 6.3 "x10 3/mm "
Neutrophil %: 88.1 %
Platelet: 237 "x10 3/mm "
RBC: 3.6 "x10 6/mm " — ABNORMAL LOW
RDW: 19.7 % — ABNORMAL HIGH
WBC: 7.1 "x10 3/mm "

## 2012-10-28 LAB — COMPREHENSIVE METABOLIC PANEL
BUN: 21 mg/dL — ABNORMAL HIGH (ref 7–18)
Calcium, Total: 9.6 mg/dL (ref 8.5–10.1)
Chloride: 100 mmol/L (ref 98–107)
Co2: 25 mmol/L (ref 21–32)
Creatinine: 1.19 mg/dL (ref 0.60–1.30)
EGFR (African American): 56 — ABNORMAL LOW
SGOT(AST): 18 U/L (ref 15–37)
SGPT (ALT): 18 U/L (ref 12–78)
Sodium: 138 mmol/L (ref 136–145)
Total Protein: 8.7 g/dL — ABNORMAL HIGH (ref 6.4–8.2)

## 2012-10-29 LAB — CANCER ANTIGEN 27.29: CA 27.29: 322.5 U/mL — ABNORMAL HIGH (ref 0.0–38.6)

## 2012-11-08 ENCOUNTER — Ambulatory Visit: Payer: Self-pay | Admitting: Oncology

## 2012-11-18 ENCOUNTER — Encounter: Payer: Self-pay | Admitting: *Deleted

## 2012-11-18 LAB — COMPREHENSIVE METABOLIC PANEL
Albumin: 3.3 g/dL — ABNORMAL LOW (ref 3.4–5.0)
Alkaline Phosphatase: 86 U/L (ref 50–136)
Calcium, Total: 9.4 mg/dL (ref 8.5–10.1)
Co2: 30 mmol/L (ref 21–32)
Creatinine: 0.9 mg/dL (ref 0.60–1.30)
EGFR (African American): >60
EGFR (Non-African Amer.): >60
Glucose: 174 mg/dL — ABNORMAL HIGH (ref 65–99)
Osmolality: 278 (ref 275–301)
Potassium: 3.3 mmol/L — ABNORMAL LOW (ref 3.5–5.1)
SGOT(AST): 20 U/L (ref 15–37)
Sodium: 138 mmol/L (ref 136–145)
Total Protein: 7.9 g/dL (ref 6.4–8.2)

## 2012-11-18 LAB — CBC CANCER CENTER
Eosinophil #: 0.1 x10 3/mm (ref 0.0–0.7)
Eosinophil %: 3.6 %
Lymphocyte %: 27.2 %
MCV: 104 fL — ABNORMAL HIGH (ref 80–100)
Monocyte %: 13.7 %
Neutrophil #: 2.3 x10 3/mm (ref 1.4–6.5)
Neutrophil %: 55.3 %
RDW: 20.1 % — ABNORMAL HIGH (ref 11.5–14.5)
WBC: 4.1 x10 3/mm (ref 3.6–11.0)

## 2012-12-03 ENCOUNTER — Encounter: Payer: Self-pay | Admitting: General Surgery

## 2012-12-03 ENCOUNTER — Ambulatory Visit (INDEPENDENT_AMBULATORY_CARE_PROVIDER_SITE_OTHER): Payer: Private Health Insurance - Indemnity | Admitting: General Surgery

## 2012-12-03 VITALS — BP 124/70 | HR 68 | Resp 14 | Ht 65.0 in | Wt 152.0 lb

## 2012-12-03 DIAGNOSIS — Z853 Personal history of malignant neoplasm of breast: Secondary | ICD-10-CM

## 2012-12-03 DIAGNOSIS — C801 Malignant (primary) neoplasm, unspecified: Secondary | ICD-10-CM

## 2012-12-03 DIAGNOSIS — C7800 Secondary malignant neoplasm of unspecified lung: Secondary | ICD-10-CM

## 2012-12-03 DIAGNOSIS — C7802 Secondary malignant neoplasm of left lung: Secondary | ICD-10-CM

## 2012-12-03 NOTE — Patient Instructions (Addendum)

## 2012-12-03 NOTE — Progress Notes (Signed)
Patient ID: Toni Harper, female   DOB: [birth year], 67 y.o.   MRN: [id number]  Chief Complaint  Patient presents with  . Other    port a cath    HPI AZANA KIESLER is a 67 y.o. female here for an evaluation of an port a cath. She is being seen by Dr Doylene Canning for treatment of lung cancer that has reoccurred. She has history of breast cancer that was treated with right mastectomy in 1996. In 2010 she had a recurrence in the lung, she has been on Xeloda.  HPI  Past Medical History  Diagnosis Date  . Cancer 1996    breast  . Hypertension   . Thyroid disease     Past Surgical History  Procedure Laterality Date  . Breast surgery Right 1996    mastectomy    History reviewed. No pertinent family history.  Social History History  Substance Use Topics  . Smoking status: Never Smoker   . Smokeless tobacco: Never Used  . Alcohol Use: No    No Known Allergies  Current Outpatient Prescriptions  Medication Sig Dispense Refill  . Alum & Mag Hydroxide-Simeth (MAGIC MOUTHWASH) SOLN Take by mouth.      Marland Kitchen atorvastatin (LIPITOR) 10 MG tablet Take 10 mg by mouth daily.      . capecitabine (XELODA) 500 MG tablet Take 1,000 mg/m2 by mouth 3 (three) times daily.      . hydrochlorothiazide (MICROZIDE) 12.5 MG capsule Take 12.5 mg by mouth daily.      Marland Kitchen levothyroxine (SYNTHROID, LEVOTHROID) 150 MCG tablet Take 150 mcg by mouth daily before breakfast.      . ondansetron (ZOFRAN-ODT) 4 MG disintegrating tablet Take 4 mg by mouth every 8 (eight) hours as needed for nausea.      . potassium chloride SA (K-DUR,KLOR-CON) 20 MEQ tablet Take 20 mEq by mouth 2 (two) times daily.      . predniSONE (DELTASONE) 20 MG tablet Take 20 mg by mouth daily.      . promethazine (PHENERGAN) 25 MG tablet Take 25 mg by mouth every 6 (six) hours as needed for nausea.      Marland Kitchen tiotropium (SPIRIVA) 18 MCG inhalation capsule Place 18 mcg into inhaler and inhale daily.      . traMADol (ULTRAM) 50 MG tablet Take 50 mg by  mouth every 6 (six) hours as needed for pain.       No current facility-administered medications for this visit.    Review of Systems Review of Systems  Constitutional: Negative.   Respiratory: Negative.   Cardiovascular: Negative.     Blood pressure 124/70, pulse 68, resp. rate 14, height 5\' 5"  (1.651 m), weight 152 lb (68.947 kg).  Physical Exam Physical Exam  Constitutional: She is oriented to person, place, and time. She appears well-developed and well-nourished.  Neck: No tracheal deviation present. No mass and no thyromegaly present.  Engorged veins in the neck and upper chest.  Cardiovascular: Normal rate, regular rhythm and normal heart sounds.   Pulmonary/Chest: She has wheezes (scattered bilateral).  Lymphadenopathy:    She has no cervical adenopathy.    She has no axillary adenopathy.  Neurological: She is alert and oriented to person, place, and time.    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      Plan    Discussed port placement with pt and she is agreeable. Procedure, risks and benefits  explained.       Patient's surgery has been scheduled for 12-06-12 at [facility].   Daryl Quiros G 12/03/2012, 4:09 PM

## 2012-12-05 ENCOUNTER — Ambulatory Visit: Payer: Self-pay | Admitting: Anesthesiology

## 2012-12-06 ENCOUNTER — Ambulatory Visit: Payer: Self-pay | Admitting: General Surgery

## 2012-12-06 DIAGNOSIS — C50919 Malignant neoplasm of unspecified site of unspecified female breast: Secondary | ICD-10-CM

## 2012-12-09 ENCOUNTER — Ambulatory Visit: Payer: Self-pay | Admitting: Oncology

## 2012-12-10 ENCOUNTER — Encounter: Payer: Self-pay | Admitting: General Surgery

## 2012-12-10 LAB — COMPREHENSIVE METABOLIC PANEL
Alkaline Phosphatase: 88 U/L (ref 50–136)
Anion Gap: 8 (ref 7–16)
BUN: 20 mg/dL — ABNORMAL HIGH (ref 7–18)
Calcium, Total: 9.6 mg/dL (ref 8.5–10.1)
Chloride: 99 mmol/L (ref 98–107)
Creatinine: 0.86 mg/dL (ref 0.60–1.30)
EGFR (Non-African Amer.): >60
Glucose: 160 mg/dL — ABNORMAL HIGH (ref 65–99)
Osmolality: 284 (ref 275–301)
Potassium: 3.8 mmol/L (ref 3.5–5.1)
SGOT(AST): 24 U/L (ref 15–37)
SGPT (ALT): 22 U/L (ref 12–78)
Sodium: 139 mmol/L (ref 136–145)

## 2012-12-10 LAB — CBC CANCER CENTER
Basophil %: 1.6 %
Eosinophil #: 0.1 x10 3/mm (ref 0.0–0.7)
HCT: 37.2 % (ref 35.0–47.0)
Lymphocyte #: 2.3 x10 3/mm (ref 1.0–3.6)
Lymphocyte %: 26.5 %
MCH: 35.8 pg — ABNORMAL HIGH (ref 26.0–34.0)
MCV: 107 fL — ABNORMAL HIGH (ref 80–100)
Monocyte #: 0.8 x10 3/mm (ref 0.2–0.9)
Monocyte %: 9.7 %
RBC: 3.49 10*6/uL — ABNORMAL LOW (ref 3.80–5.20)
WBC: 8.7 x10 3/mm (ref 3.6–11.0)

## 2012-12-11 ENCOUNTER — Encounter: Payer: Self-pay | Admitting: General Surgery

## 2012-12-16 LAB — CBC CANCER CENTER
Basophil #: 0 x10 3/mm (ref 0.0–0.1)
Eosinophil %: 3.2 %
HCT: 37.5 % (ref 35.0–47.0)
HGB: 12.6 g/dL (ref 12.0–16.0)
Lymphocyte %: 18.8 %
MCH: 36 pg — ABNORMAL HIGH (ref 26.0–34.0)
MCHC: 33.5 g/dL (ref 32.0–36.0)
Monocyte #: 0.3 x10 3/mm (ref 0.2–0.9)
Monocyte %: 7.8 %
Neutrophil #: 2.4 x10 3/mm (ref 1.4–6.5)
Platelet: 70 x10 3/mm — ABNORMAL LOW (ref 150–440)
RBC: 3.49 10*6/uL — ABNORMAL LOW (ref 3.80–5.20)
RDW: 18.7 % — ABNORMAL HIGH (ref 11.5–14.5)
WBC: 3.5 x10 3/mm — ABNORMAL LOW (ref 3.6–11.0)

## 2012-12-23 ENCOUNTER — Encounter: Payer: Self-pay | Admitting: General Surgery

## 2012-12-23 ENCOUNTER — Ambulatory Visit (INDEPENDENT_AMBULATORY_CARE_PROVIDER_SITE_OTHER): Payer: Private Health Insurance - Indemnity | Admitting: General Surgery

## 2012-12-23 VITALS — BP 120/80 | HR 78 | Resp 16 | Ht 65.0 in | Wt 149.0 lb

## 2012-12-23 DIAGNOSIS — Z853 Personal history of malignant neoplasm of breast: Secondary | ICD-10-CM

## 2012-12-23 LAB — CBC CANCER CENTER
Basophil #: 0.1 x10 3/mm (ref 0.0–0.1)
Basophil %: 0.8 %
Eosinophil #: 0 x10 3/mm (ref 0.0–0.7)
Eosinophil %: 0.2 %
HGB: 12.4 g/dL (ref 12.0–16.0)
Lymphocyte #: 1.3 x10 3/mm (ref 1.0–3.6)
Lymphocyte %: 11.8 %
MCH: 35.7 pg — ABNORMAL HIGH (ref 26.0–34.0)
MCHC: 33.1 g/dL (ref 32.0–36.0)
MCV: 108 fL — ABNORMAL HIGH (ref 80–100)
Monocyte #: 1 x10 3/mm — ABNORMAL HIGH (ref 0.2–0.9)
Neutrophil #: 8.4 x10 3/mm — ABNORMAL HIGH (ref 1.4–6.5)
Platelet: 149 x10 3/mm — ABNORMAL LOW (ref 150–440)
RBC: 3.47 10*6/uL — ABNORMAL LOW (ref 3.80–5.20)
RDW: 18.8 % — ABNORMAL HIGH (ref 11.5–14.5)
WBC: 10.7 x10 3/mm (ref 3.6–11.0)

## 2012-12-23 NOTE — Progress Notes (Signed)
This is an 67 year old female here today for her 2 week post port placement. No complaints. She states she is feeling better. Port site looks clean . Venous distension in neck appears improved. Her catheter is in subclavian vein-could not advance further. Port is working well though. F/U prn.

## 2012-12-23 NOTE — Patient Instructions (Addendum)
Patient to return as needed. 

## 2012-12-30 LAB — COMPREHENSIVE METABOLIC PANEL
Albumin: 3.9 g/dL (ref 3.4–5.0)
BUN: 13 mg/dL (ref 7–18)
Chloride: 97 mmol/L — ABNORMAL LOW (ref 98–107)
Co2: 27 mmol/L (ref 21–32)
Creatinine: 0.93 mg/dL (ref 0.60–1.30)
Glucose: 212 mg/dL — ABNORMAL HIGH (ref 65–99)
Osmolality: 280 (ref 275–301)
SGOT(AST): 22 U/L (ref 15–37)
SGPT (ALT): 25 U/L (ref 12–78)
Sodium: 137 mmol/L (ref 136–145)
Total Protein: 8.3 g/dL — ABNORMAL HIGH (ref 6.4–8.2)

## 2012-12-30 LAB — CBC CANCER CENTER
Basophil %: 0.8 %
Eosinophil %: 0 %
HGB: 12.6 g/dL (ref 12.0–16.0)
MCH: 36 pg — ABNORMAL HIGH (ref 26.0–34.0)
MCV: 108 fL — ABNORMAL HIGH (ref 80–100)
Monocyte %: 12 %
Neutrophil #: 5.5 x10 3/mm (ref 1.4–6.5)
Neutrophil %: 69.3 %
Platelet: 344 x10 3/mm (ref 150–440)
RBC: 3.51 10*6/uL — ABNORMAL LOW (ref 3.80–5.20)
WBC: 8 x10 3/mm (ref 3.6–11.0)

## 2013-01-06 LAB — CBC CANCER CENTER
Basophil #: 0 x10 3/mm (ref 0.0–0.1)
Basophil %: 1.1 %
Eosinophil #: 0 x10 3/mm (ref 0.0–0.7)
HCT: 33.7 % — ABNORMAL LOW (ref 35.0–47.0)
Lymphocyte #: 0.9 x10 3/mm — ABNORMAL LOW (ref 1.0–3.6)
MCH: 35.8 pg — ABNORMAL HIGH (ref 26.0–34.0)
MCHC: 33 g/dL (ref 32.0–36.0)
Monocyte #: 0.1 x10 3/mm — ABNORMAL LOW (ref 0.2–0.9)
Monocyte %: 2.1 %
Neutrophil #: 2.6 x10 3/mm (ref 1.4–6.5)
Neutrophil %: 70.9 %
Platelet: 206 x10 3/mm (ref 150–440)
RBC: 3.11 10*6/uL — ABNORMAL LOW (ref 3.80–5.20)

## 2013-01-08 ENCOUNTER — Ambulatory Visit: Payer: Self-pay | Admitting: Oncology

## 2013-01-13 LAB — CBC CANCER CENTER
Eosinophil #: 0 x10 3/mm (ref 0.0–0.7)
HCT: 34.8 % — ABNORMAL LOW (ref 35.0–47.0)
Lymphocyte #: 0.9 x10 3/mm — ABNORMAL LOW (ref 1.0–3.6)
Monocyte %: 25.5 %
Neutrophil #: 1.2 x10 3/mm — ABNORMAL LOW (ref 1.4–6.5)
Neutrophil %: 40.8 %
RBC: 3.19 10*6/uL — ABNORMAL LOW (ref 3.80–5.20)
RDW: 16 % — ABNORMAL HIGH (ref 11.5–14.5)
WBC: 2.9 x10 3/mm — ABNORMAL LOW (ref 3.6–11.0)

## 2013-01-15 LAB — CANCER ANTIGEN 27.29: CA 27.29: 460.2 U/mL — ABNORMAL HIGH (ref 0.0–38.6)

## 2013-01-20 LAB — CBC CANCER CENTER
Basophil #: 0.1 x10 3/mm (ref 0.0–0.1)
Eosinophil #: 0 x10 3/mm (ref 0.0–0.7)
Lymphocyte #: 1.5 x10 3/mm (ref 1.0–3.6)
MCV: 109 fL — ABNORMAL HIGH (ref 80–100)
Monocyte #: 1.4 x10 3/mm — ABNORMAL HIGH (ref 0.2–0.9)
Monocyte %: 17.4 %
Neutrophil %: 63.1 %
Platelet: 317 x10 3/mm (ref 150–440)
RBC: 3.43 10*6/uL — ABNORMAL LOW (ref 3.80–5.20)
RDW: 15.2 % — ABNORMAL HIGH (ref 11.5–14.5)

## 2013-01-20 LAB — COMPREHENSIVE METABOLIC PANEL
Anion Gap: 11 (ref 7–16)
BUN: 10 mg/dL (ref 7–18)
Bilirubin,Total: 0.8 mg/dL (ref 0.2–1.0)
Chloride: 98 mmol/L (ref 98–107)
Co2: 30 mmol/L (ref 21–32)
Glucose: 168 mg/dL — ABNORMAL HIGH (ref 65–99)
Potassium: 3.6 mmol/L (ref 3.5–5.1)
SGPT (ALT): 28 U/L (ref 12–78)
Total Protein: 7.8 g/dL (ref 6.4–8.2)

## 2013-01-27 LAB — CBC CANCER CENTER
Basophil #: 0 x10 3/mm (ref 0.0–0.1)
HCT: 35.7 % (ref 35.0–47.0)
HGB: 11.9 g/dL — ABNORMAL LOW (ref 12.0–16.0)
Lymphocyte #: 0.5 x10 3/mm — ABNORMAL LOW (ref 1.0–3.6)
MCHC: 33.3 g/dL (ref 32.0–36.0)
Monocyte %: 11.4 %
Neutrophil #: 7.4 x10 3/mm — ABNORMAL HIGH (ref 1.4–6.5)
RBC: 3.28 10*6/uL — ABNORMAL LOW (ref 3.80–5.20)
RDW: 15.2 % — ABNORMAL HIGH (ref 11.5–14.5)

## 2013-02-03 LAB — CBC CANCER CENTER
Basophil #: 0.1 x10 3/mm (ref 0.0–0.1)
Basophil %: 0.9 %
Eosinophil #: 0 x10 3/mm (ref 0.0–0.7)
Eosinophil %: 0.1 %
Lymphocyte #: 0.9 x10 3/mm — ABNORMAL LOW (ref 1.0–3.6)
Lymphocyte %: 7.1 %
MCH: 35.7 pg — ABNORMAL HIGH (ref 26.0–34.0)
MCV: 109 fL — ABNORMAL HIGH (ref 80–100)
Monocyte #: 0.6 x10 3/mm (ref 0.2–0.9)
Neutrophil %: 87.1 %
RBC: 3.33 10*6/uL — ABNORMAL LOW (ref 3.80–5.20)
RDW: 15.9 % — ABNORMAL HIGH (ref 11.5–14.5)
WBC: 12.4 x10 3/mm — ABNORMAL HIGH (ref 3.6–11.0)

## 2013-02-08 ENCOUNTER — Ambulatory Visit: Payer: Self-pay | Admitting: Oncology

## 2013-02-10 LAB — COMPREHENSIVE METABOLIC PANEL
Anion Gap: 9 (ref 7–16)
Bilirubin,Total: 0.6 mg/dL (ref 0.2–1.0)
Calcium, Total: 9.2 mg/dL (ref 8.5–10.1)
Creatinine: 0.78 mg/dL (ref 0.60–1.30)
EGFR (Non-African Amer.): >60
Osmolality: 281 (ref 275–301)
Potassium: 4.3 mmol/L (ref 3.5–5.1)
SGOT(AST): 17 U/L (ref 15–37)
SGPT (ALT): 22 U/L (ref 12–78)
Total Protein: 7.5 g/dL (ref 6.4–8.2)

## 2013-02-10 LAB — CBC CANCER CENTER
Basophil #: 0.1 x10 3/mm (ref 0.0–0.1)
Basophil %: 1.1 %
Eosinophil #: 0 x10 3/mm (ref 0.0–0.7)
Eosinophil %: 0.1 %
Lymphocyte %: 6.7 %
MCH: 35.6 pg — ABNORMAL HIGH (ref 26.0–34.0)
MCHC: 32.5 g/dL (ref 32.0–36.0)
Monocyte #: 0.3 x10 3/mm (ref 0.2–0.9)
Neutrophil #: 6.5 x10 3/mm (ref 1.4–6.5)
Platelet: 304 x10 3/mm (ref 150–440)
RBC: 3.2 10*6/uL — ABNORMAL LOW (ref 3.80–5.20)
RDW: 15.5 % — ABNORMAL HIGH (ref 11.5–14.5)

## 2013-02-16 ENCOUNTER — Other Ambulatory Visit: Payer: Self-pay | Admitting: General Surgery

## 2013-02-17 LAB — CBC CANCER CENTER
Basophil #: 0 x10 3/mm (ref 0.0–0.1)
Basophil %: 0.8 %
Eosinophil #: 0 x10 3/mm (ref 0.0–0.7)
HGB: 11.5 g/dL — ABNORMAL LOW (ref 12.0–16.0)
Lymphocyte #: 0.6 x10 3/mm — ABNORMAL LOW (ref 1.0–3.6)
MCH: 35.2 pg — ABNORMAL HIGH (ref 26.0–34.0)
MCHC: 32.4 g/dL (ref 32.0–36.0)
Monocyte #: 0.6 x10 3/mm (ref 0.2–0.9)
Neutrophil #: 4.5 x10 3/mm (ref 1.4–6.5)
RDW: 14.8 % — ABNORMAL HIGH (ref 11.5–14.5)
WBC: 5.7 x10 3/mm (ref 3.6–11.0)

## 2013-02-18 NOTE — Telephone Encounter (Signed)
Patient states her pain is in her abdomen ( no pain from recent port placement) and that she would check with Dr Doylene Canning office for medication ( currently under his care and the hydrocodone he gave her was too strong). She will call back if she has problems.

## 2013-02-24 LAB — CBC CANCER CENTER
Basophil #: 0.1 x10 3/mm (ref 0.0–0.1)
Basophil %: 0.7 %
Lymphocyte #: 0.6 x10 3/mm — ABNORMAL LOW (ref 1.0–3.6)
Lymphocyte %: 7.8 %
MCH: 34.8 pg — ABNORMAL HIGH (ref 26.0–34.0)
MCHC: 32.1 g/dL (ref 32.0–36.0)
MCV: 109 fL — ABNORMAL HIGH (ref 80–100)
Monocyte #: 0.6 x10 3/mm (ref 0.2–0.9)
Monocyte %: 7.3 %
Neutrophil %: 84 %
Platelet: 224 x10 3/mm (ref 150–440)
RBC: 3.22 10*6/uL — ABNORMAL LOW (ref 3.80–5.20)

## 2013-02-25 LAB — CANCER ANTIGEN 27.29: CA 27.29: 505.5 U/mL — ABNORMAL HIGH (ref 0.0–38.6)

## 2013-03-03 LAB — COMPREHENSIVE METABOLIC PANEL
Anion Gap: 9 (ref 7–16)
BUN: 16 mg/dL (ref 7–18)
Bilirubin,Total: 0.8 mg/dL (ref 0.2–1.0)
Calcium, Total: 9.3 mg/dL (ref 8.5–10.1)
EGFR (Non-African Amer.): >60
Glucose: 188 mg/dL — ABNORMAL HIGH (ref 65–99)
Osmolality: 280 (ref 275–301)
SGOT(AST): 19 U/L (ref 15–37)
SGPT (ALT): 22 U/L (ref 12–78)
Sodium: 137 mmol/L (ref 136–145)
Total Protein: 7.6 g/dL (ref 6.4–8.2)

## 2013-03-03 LAB — CBC CANCER CENTER
Basophil %: 0.2 %
Eosinophil #: 0 x10 3/mm (ref 0.0–0.7)
HGB: 12 g/dL (ref 12.0–16.0)
Lymphocyte %: 5.5 %
MCH: 36.4 pg — ABNORMAL HIGH (ref 26.0–34.0)
MCHC: 34 g/dL (ref 32.0–36.0)
MCV: 107 fL — ABNORMAL HIGH (ref 80–100)
Monocyte #: 0.3 x10 3/mm (ref 0.2–0.9)
Neutrophil #: 7.4 x10 3/mm — ABNORMAL HIGH (ref 1.4–6.5)
WBC: 8.2 x10 3/mm (ref 3.6–11.0)

## 2013-03-10 ENCOUNTER — Ambulatory Visit: Payer: Self-pay | Admitting: Oncology

## 2013-03-10 LAB — CBC CANCER CENTER
Basophil #: 0 x10 3/mm (ref 0.0–0.1)
Eosinophil %: 0.8 %
HCT: 36.1 % (ref 35.0–47.0)
Lymphocyte %: 27.4 %
MCH: 35.3 pg — ABNORMAL HIGH (ref 26.0–34.0)
MCV: 109 fL — ABNORMAL HIGH (ref 80–100)
Monocyte #: 0.3 x10 3/mm (ref 0.2–0.9)
Neutrophil %: 56 %
Platelet: 60 x10 3/mm — ABNORMAL LOW (ref 150–440)
RBC: 3.32 10*6/uL — ABNORMAL LOW (ref 3.80–5.20)

## 2013-03-17 LAB — CBC CANCER CENTER
Basophil #: 0 x10 3/mm (ref 0.0–0.1)
Eosinophil #: 0 x10 3/mm (ref 0.0–0.7)
Eosinophil %: 0.1 %
HCT: 36.8 % (ref 35.0–47.0)
Lymphocyte #: 0.7 x10 3/mm — ABNORMAL LOW (ref 1.0–3.6)
Lymphocyte %: 8.4 %
MCHC: 32.1 g/dL (ref 32.0–36.0)
Monocyte #: 0.2 x10 3/mm (ref 0.2–0.9)
Neutrophil #: 7.1 x10 3/mm — ABNORMAL HIGH (ref 1.4–6.5)
Neutrophil %: 88.9 %
RBC: 3.38 10*6/uL — ABNORMAL LOW (ref 3.80–5.20)
WBC: 8 x10 3/mm (ref 3.6–11.0)

## 2013-03-20 ENCOUNTER — Ambulatory Visit: Payer: Self-pay | Admitting: Oncology

## 2013-03-24 LAB — CBC CANCER CENTER
Basophil #: 0 x10 3/mm (ref 0.0–0.1)
Eosinophil #: 0 x10 3/mm (ref 0.0–0.7)
Eosinophil %: 0.1 %
HCT: 34.3 % — ABNORMAL LOW (ref 35.0–47.0)
HGB: 11 g/dL — ABNORMAL LOW (ref 12.0–16.0)
Lymphocyte #: 0.3 x10 3/mm — ABNORMAL LOW (ref 1.0–3.6)
Lymphocyte %: 5.7 %
MCHC: 31.9 g/dL — ABNORMAL LOW (ref 32.0–36.0)
Monocyte #: 0.2 x10 3/mm (ref 0.2–0.9)
Monocyte %: 3.4 %
Neutrophil #: 4.6 x10 3/mm (ref 1.4–6.5)
Neutrophil %: 90.5 %
Platelet: 307 x10 3/mm (ref 150–440)
RBC: 3.15 10*6/uL — ABNORMAL LOW (ref 3.80–5.20)
RDW: 15.4 % — ABNORMAL HIGH (ref 11.5–14.5)

## 2013-03-24 LAB — COMPREHENSIVE METABOLIC PANEL
Albumin: 3.5 g/dL (ref 3.4–5.0)
Alkaline Phosphatase: 93 U/L
BUN: 12 mg/dL (ref 7–18)
Bilirubin,Total: 0.6 mg/dL (ref 0.2–1.0)
Calcium, Total: 8.8 mg/dL (ref 8.5–10.1)
Chloride: 100 mmol/L (ref 98–107)
EGFR (African American): >60
EGFR (Non-African Amer.): >60
Glucose: 217 mg/dL — ABNORMAL HIGH (ref 65–99)
Osmolality: 286 (ref 275–301)
Potassium: 3.8 mmol/L (ref 3.5–5.1)
SGPT (ALT): 20 U/L (ref 12–78)
Total Protein: 7.3 g/dL (ref 6.4–8.2)

## 2013-03-31 LAB — CBC CANCER CENTER
Basophil #: 0 x10 3/mm (ref 0.0–0.1)
Basophil %: 0.7 %
Eosinophil #: 0 x10 3/mm (ref 0.0–0.7)
HGB: 11.5 g/dL — ABNORMAL LOW (ref 12.0–16.0)
MCH: 35.4 pg — ABNORMAL HIGH (ref 26.0–34.0)
MCHC: 32.5 g/dL (ref 32.0–36.0)
Platelet: 54 x10 3/mm — ABNORMAL LOW (ref 150–440)
RDW: 14.5 % (ref 11.5–14.5)
WBC: 3 x10 3/mm — ABNORMAL LOW (ref 3.6–11.0)

## 2013-04-07 LAB — CBC CANCER CENTER
Basophil #: 0 x10 3/mm (ref 0.0–0.1)
Eosinophil %: 0.5 %
HCT: 37 % (ref 35.0–47.0)
Lymphocyte %: 12.5 %
MCV: 108 fL — ABNORMAL HIGH (ref 80–100)
Monocyte %: 12.5 %
Neutrophil #: 4.5 x10 3/mm (ref 1.4–6.5)
Platelet: 229 x10 3/mm (ref 150–440)
WBC: 6.1 x10 3/mm (ref 3.6–11.0)

## 2013-04-10 ENCOUNTER — Ambulatory Visit: Payer: Self-pay | Admitting: Oncology

## 2013-04-14 LAB — COMPREHENSIVE METABOLIC PANEL
ALBUMIN: 3.4 g/dL (ref 3.4–5.0)
ALT: 17 U/L (ref 12–78)
ANION GAP: 11 (ref 7–16)
Alkaline Phosphatase: 89 U/L
BUN: 11 mg/dL (ref 7–18)
Bilirubin,Total: 0.7 mg/dL (ref 0.2–1.0)
CALCIUM: 9.1 mg/dL (ref 8.5–10.1)
CHLORIDE: 103 mmol/L (ref 98–107)
CO2: 27 mmol/L (ref 21–32)
Creatinine: 0.84 mg/dL (ref 0.60–1.30)
EGFR (African American): >60
EGFR (Non-African Amer.): >60
GLUCOSE: 169 mg/dL — AB (ref 65–99)
Osmolality: 285 (ref 275–301)
Potassium: 3.5 mmol/L (ref 3.5–5.1)
SGOT(AST): 19 U/L (ref 15–37)
SODIUM: 141 mmol/L (ref 136–145)
TOTAL PROTEIN: 7.3 g/dL (ref 6.4–8.2)

## 2013-04-14 LAB — CBC CANCER CENTER
Basophil #: 0.1 x10 3/mm (ref 0.0–0.1)
Basophil %: 0.8 %
Eosinophil #: 0 x10 3/mm (ref 0.0–0.7)
Eosinophil %: 0.2 %
HCT: 35 % (ref 35.0–47.0)
HGB: 11.3 g/dL — ABNORMAL LOW (ref 12.0–16.0)
Lymphocyte #: 0.5 x10 3/mm — ABNORMAL LOW (ref 1.0–3.6)
Lymphocyte %: 8.1 %
MCH: 35.2 pg — AB (ref 26.0–34.0)
MCHC: 32.2 g/dL (ref 32.0–36.0)
MCV: 109 fL — AB (ref 80–100)
MONO ABS: 0.5 x10 3/mm (ref 0.2–0.9)
Monocyte %: 7.1 %
Neutrophil #: 5.7 x10 3/mm (ref 1.4–6.5)
Neutrophil %: 83.8 %
Platelet: 288 x10 3/mm (ref 150–440)
RBC: 3.2 10*6/uL — AB (ref 3.80–5.20)
RDW: 14.7 % — ABNORMAL HIGH (ref 11.5–14.5)
WBC: 6.8 x10 3/mm (ref 3.6–11.0)

## 2013-04-15 LAB — CANCER ANTIGEN 27.29: CA 27.29: 430.9 U/mL — ABNORMAL HIGH (ref 0.0–38.6)

## 2013-04-21 LAB — CBC CANCER CENTER
BASOS PCT: 0.7 %
Basophil #: 0 x10 3/mm (ref 0.0–0.1)
EOS PCT: 0.1 %
Eosinophil #: 0 x10 3/mm (ref 0.0–0.7)
HCT: 34 % — AB (ref 35.0–47.0)
HGB: 11.1 g/dL — ABNORMAL LOW (ref 12.0–16.0)
LYMPHS ABS: 0.3 x10 3/mm — AB (ref 1.0–3.6)
Lymphocyte %: 6 %
MCH: 35.4 pg — AB (ref 26.0–34.0)
MCHC: 32.5 g/dL (ref 32.0–36.0)
MCV: 109 fL — AB (ref 80–100)
MONOS PCT: 12 %
Monocyte #: 0.7 x10 3/mm (ref 0.2–0.9)
Neutrophil #: 4.5 x10 3/mm (ref 1.4–6.5)
Neutrophil %: 81.2 %
Platelet: 95 x10 3/mm — ABNORMAL LOW (ref 150–440)
RBC: 3.12 10*6/uL — ABNORMAL LOW (ref 3.80–5.20)
RDW: 14.1 % (ref 11.5–14.5)
WBC: 5.5 x10 3/mm (ref 3.6–11.0)

## 2013-04-28 LAB — CBC CANCER CENTER
BASOS PCT: 0.2 %
Basophil #: 0 x10 3/mm (ref 0.0–0.1)
EOS PCT: 0.1 %
Eosinophil #: 0 x10 3/mm (ref 0.0–0.7)
HCT: 33.9 % — ABNORMAL LOW (ref 35.0–47.0)
HGB: 10.8 g/dL — ABNORMAL LOW (ref 12.0–16.0)
LYMPHS PCT: 4.9 %
Lymphocyte #: 0.8 x10 3/mm — ABNORMAL LOW (ref 1.0–3.6)
MCH: 34.6 pg — ABNORMAL HIGH (ref 26.0–34.0)
MCHC: 31.9 g/dL — AB (ref 32.0–36.0)
MCV: 108 fL — AB (ref 80–100)
Monocyte #: 0.8 x10 3/mm (ref 0.2–0.9)
Monocyte %: 5.2 %
Neutrophil #: 13.8 x10 3/mm — ABNORMAL HIGH (ref 1.4–6.5)
Neutrophil %: 89.6 %
PLATELETS: 245 x10 3/mm (ref 150–440)
RBC: 3.13 10*6/uL — AB (ref 3.80–5.20)
RDW: 14.2 % (ref 11.5–14.5)
WBC: 15.4 x10 3/mm — AB (ref 3.6–11.0)

## 2013-05-05 LAB — CBC CANCER CENTER
BASOS ABS: 0.1 x10 3/mm (ref 0.0–0.1)
Basophil %: 0.8 %
EOS ABS: 0 x10 3/mm (ref 0.0–0.7)
Eosinophil %: 0.5 %
HCT: 36.7 % (ref 35.0–47.0)
HGB: 11.8 g/dL — ABNORMAL LOW (ref 12.0–16.0)
LYMPHS ABS: 0.7 x10 3/mm — AB (ref 1.0–3.6)
LYMPHS PCT: 9.5 %
MCH: 35.1 pg — AB (ref 26.0–34.0)
MCHC: 32.2 g/dL (ref 32.0–36.0)
MCV: 109 fL — ABNORMAL HIGH (ref 80–100)
MONOS PCT: 8.4 %
Monocyte #: 0.6 x10 3/mm (ref 0.2–0.9)
NEUTROS PCT: 80.8 %
Neutrophil #: 5.9 x10 3/mm (ref 1.4–6.5)
Platelet: 354 x10 3/mm (ref 150–440)
RBC: 3.37 10*6/uL — AB (ref 3.80–5.20)
RDW: 14.8 % — AB (ref 11.5–14.5)
WBC: 7.3 x10 3/mm (ref 3.6–11.0)

## 2013-05-11 ENCOUNTER — Ambulatory Visit: Payer: Self-pay | Admitting: Oncology

## 2013-05-12 LAB — CBC CANCER CENTER
Basophil #: 0.1 x10 3/mm (ref 0.0–0.1)
Basophil %: 1.1 %
EOS ABS: 0 x10 3/mm (ref 0.0–0.7)
EOS PCT: 0.6 %
HCT: 35.5 % (ref 35.0–47.0)
HGB: 11.6 g/dL — AB (ref 12.0–16.0)
Lymphocyte #: 0.6 x10 3/mm — ABNORMAL LOW (ref 1.0–3.6)
Lymphocyte %: 9.4 %
MCH: 35.6 pg — ABNORMAL HIGH (ref 26.0–34.0)
MCHC: 32.7 g/dL (ref 32.0–36.0)
MCV: 109 fL — ABNORMAL HIGH (ref 80–100)
MONO ABS: 0.5 x10 3/mm (ref 0.2–0.9)
Monocyte %: 8.4 %
Neutrophil #: 5.1 x10 3/mm (ref 1.4–6.5)
Neutrophil %: 80.5 %
PLATELETS: 235 x10 3/mm (ref 150–440)
RBC: 3.26 10*6/uL — ABNORMAL LOW (ref 3.80–5.20)
RDW: 13.7 % (ref 11.5–14.5)
WBC: 6.3 x10 3/mm (ref 3.6–11.0)

## 2013-05-12 LAB — COMPREHENSIVE METABOLIC PANEL
ALBUMIN: 3.6 g/dL (ref 3.4–5.0)
ALT: 18 U/L (ref 12–78)
ANION GAP: 12 (ref 7–16)
AST: 19 U/L (ref 15–37)
Alkaline Phosphatase: 95 U/L
BUN: 13 mg/dL (ref 7–18)
Bilirubin,Total: 0.6 mg/dL (ref 0.2–1.0)
CHLORIDE: 102 mmol/L (ref 98–107)
Calcium, Total: 8.4 mg/dL — ABNORMAL LOW (ref 8.5–10.1)
Co2: 26 mmol/L (ref 21–32)
Creatinine: 0.75 mg/dL (ref 0.60–1.30)
EGFR (African American): >60
EGFR (Non-African Amer.): >60
Glucose: 144 mg/dL — ABNORMAL HIGH (ref 65–99)
Osmolality: 282 (ref 275–301)
POTASSIUM: 3.5 mmol/L (ref 3.5–5.1)
Sodium: 140 mmol/L (ref 136–145)
Total Protein: 7.5 g/dL (ref 6.4–8.2)

## 2013-05-13 LAB — CANCER ANTIGEN 27.29: CA 27.29: 368.2 U/mL — ABNORMAL HIGH (ref 0.0–38.6)

## 2013-05-19 LAB — CBC CANCER CENTER
BANDS NEUTROPHIL: 10 %
BASOS ABS: 1 %
COMMENT - H1-COM6: NORMAL
Eosinophil: 1 %
HCT: 36.2 % (ref 35.0–47.0)
HGB: 11.6 g/dL — ABNORMAL LOW (ref 12.0–16.0)
LYMPHS PCT: 12 %
MCH: 34.9 pg — AB (ref 26.0–34.0)
MCHC: 32 g/dL (ref 32.0–36.0)
MCV: 109 fL — ABNORMAL HIGH (ref 80–100)
MYELOCYTE: 1 %
Metamyelocyte: 2 %
Monocytes: 9 %
RBC: 3.32 10*6/uL — ABNORMAL LOW (ref 3.80–5.20)
RDW: 13.5 % (ref 11.5–14.5)
Segmented Neutrophils: 64 %
WBC: 3.4 x10 3/mm — AB (ref 3.6–11.0)

## 2013-05-26 LAB — CBC CANCER CENTER
Basophil #: 0 x10 3/mm (ref 0.0–0.1)
Basophil %: 0.7 %
EOS PCT: 0.3 %
Eosinophil #: 0 x10 3/mm (ref 0.0–0.7)
HCT: 35.7 % (ref 35.0–47.0)
HGB: 11.6 g/dL — ABNORMAL LOW (ref 12.0–16.0)
LYMPHS ABS: 0.8 x10 3/mm — AB (ref 1.0–3.6)
Lymphocyte %: 12 %
MCH: 34.9 pg — ABNORMAL HIGH (ref 26.0–34.0)
MCHC: 32.4 g/dL (ref 32.0–36.0)
MCV: 108 fL — AB (ref 80–100)
MONO ABS: 0.8 x10 3/mm (ref 0.2–0.9)
MONOS PCT: 11.9 %
NEUTROS ABS: 5 x10 3/mm (ref 1.4–6.5)
NEUTROS PCT: 75.1 %
Platelet: 270 x10 3/mm (ref 150–440)
RBC: 3.31 10*6/uL — ABNORMAL LOW (ref 3.80–5.20)
RDW: 14.2 % (ref 11.5–14.5)
WBC: 6.7 x10 3/mm (ref 3.6–11.0)

## 2013-06-02 LAB — CBC CANCER CENTER
BASOS PCT: 0.8 %
Basophil #: 0.1 x10 3/mm (ref 0.0–0.1)
EOS ABS: 0 x10 3/mm (ref 0.0–0.7)
EOS PCT: 0.2 %
HCT: 35.9 % (ref 35.0–47.0)
HGB: 11.7 g/dL — AB (ref 12.0–16.0)
LYMPHS PCT: 16 %
Lymphocyte #: 1 x10 3/mm (ref 1.0–3.6)
MCH: 35.3 pg — ABNORMAL HIGH (ref 26.0–34.0)
MCHC: 32.6 g/dL (ref 32.0–36.0)
MCV: 109 fL — ABNORMAL HIGH (ref 80–100)
Monocyte #: 1.2 x10 3/mm — ABNORMAL HIGH (ref 0.2–0.9)
Monocyte %: 19 %
Neutrophil #: 4 x10 3/mm (ref 1.4–6.5)
Neutrophil %: 64 %
Platelet: 329 x10 3/mm (ref 150–440)
RBC: 3.3 10*6/uL — ABNORMAL LOW (ref 3.80–5.20)
RDW: 14 % (ref 11.5–14.5)
WBC: 6.3 x10 3/mm (ref 3.6–11.0)

## 2013-06-08 ENCOUNTER — Ambulatory Visit: Payer: Self-pay | Admitting: Oncology

## 2013-06-09 LAB — COMPREHENSIVE METABOLIC PANEL
ANION GAP: 9 (ref 7–16)
Albumin: 3.5 g/dL (ref 3.4–5.0)
Alkaline Phosphatase: 86 U/L
BUN: 16 mg/dL (ref 7–18)
Bilirubin,Total: 0.7 mg/dL (ref 0.2–1.0)
CO2: 28 mmol/L (ref 21–32)
Calcium, Total: 8.8 mg/dL (ref 8.5–10.1)
Chloride: 100 mmol/L (ref 98–107)
Creatinine: 0.86 mg/dL (ref 0.60–1.30)
EGFR (African American): >60
EGFR (Non-African Amer.): >60
Glucose: 226 mg/dL — ABNORMAL HIGH (ref 65–99)
OSMOLALITY: 282 (ref 275–301)
Potassium: 4 mmol/L (ref 3.5–5.1)
SGOT(AST): 17 U/L (ref 15–37)
SGPT (ALT): 20 U/L (ref 12–78)
SODIUM: 137 mmol/L (ref 136–145)
Total Protein: 7.6 g/dL (ref 6.4–8.2)

## 2013-06-09 LAB — CBC CANCER CENTER
Basophil #: 0 x10 3/mm (ref 0.0–0.1)
Basophil %: 0.4 %
EOS ABS: 0 x10 3/mm (ref 0.0–0.7)
Eosinophil %: 0.5 %
HCT: 37.5 % (ref 35.0–47.0)
HGB: 11.9 g/dL — ABNORMAL LOW (ref 12.0–16.0)
LYMPHS PCT: 5.3 %
Lymphocyte #: 0.4 x10 3/mm — ABNORMAL LOW (ref 1.0–3.6)
MCH: 34.4 pg — ABNORMAL HIGH (ref 26.0–34.0)
MCHC: 31.8 g/dL — ABNORMAL LOW (ref 32.0–36.0)
MCV: 108 fL — ABNORMAL HIGH (ref 80–100)
MONOS PCT: 2.6 %
Monocyte #: 0.2 x10 3/mm (ref 0.2–0.9)
NEUTROS ABS: 6.2 x10 3/mm (ref 1.4–6.5)
Neutrophil %: 91.2 %
Platelet: 230 x10 3/mm (ref 150–440)
RBC: 3.47 10*6/uL — ABNORMAL LOW (ref 3.80–5.20)
RDW: 13.7 % (ref 11.5–14.5)
WBC: 6.9 x10 3/mm (ref 3.6–11.0)

## 2013-06-10 ENCOUNTER — Ambulatory Visit: Payer: Self-pay | Admitting: Oncology

## 2013-06-18 LAB — CBC CANCER CENTER
BASOS ABS: 0 x10 3/mm (ref 0.0–0.1)
Basophil %: 0.7 %
EOS ABS: 0 x10 3/mm (ref 0.0–0.7)
Eosinophil %: 0.8 %
HCT: 36.7 % (ref 35.0–47.0)
HGB: 11.9 g/dL — ABNORMAL LOW (ref 12.0–16.0)
LYMPHS ABS: 0.5 x10 3/mm — AB (ref 1.0–3.6)
Lymphocyte %: 11.1 %
MCH: 34.9 pg — ABNORMAL HIGH (ref 26.0–34.0)
MCHC: 32.4 g/dL (ref 32.0–36.0)
MCV: 108 fL — ABNORMAL HIGH (ref 80–100)
MONOS PCT: 10.7 %
Monocyte #: 0.5 x10 3/mm (ref 0.2–0.9)
NEUTROS PCT: 76.7 %
Neutrophil #: 3.7 x10 3/mm (ref 1.4–6.5)
Platelet: 63 x10 3/mm — ABNORMAL LOW (ref 150–440)
RBC: 3.41 10*6/uL — AB (ref 3.80–5.20)
RDW: 13.4 % (ref 11.5–14.5)
WBC: 4.8 x10 3/mm (ref 3.6–11.0)

## 2013-06-25 LAB — CBC CANCER CENTER
BASOS ABS: 0.1 x10 3/mm (ref 0.0–0.1)
BASOS PCT: 0.5 %
EOS PCT: 0.1 %
Eosinophil #: 0 x10 3/mm (ref 0.0–0.7)
HCT: 35.6 % (ref 35.0–47.0)
HGB: 11.4 g/dL — ABNORMAL LOW (ref 12.0–16.0)
LYMPHS ABS: 0.6 x10 3/mm — AB (ref 1.0–3.6)
Lymphocyte %: 5.2 %
MCH: 34.4 pg — ABNORMAL HIGH (ref 26.0–34.0)
MCHC: 32 g/dL (ref 32.0–36.0)
MCV: 108 fL — ABNORMAL HIGH (ref 80–100)
Monocyte #: 0.8 x10 3/mm (ref 0.2–0.9)
Monocyte %: 7.1 %
Neutrophil #: 9.9 x10 3/mm — ABNORMAL HIGH (ref 1.4–6.5)
Neutrophil %: 87.1 %
Platelet: 251 x10 3/mm (ref 150–440)
RBC: 3.3 10*6/uL — ABNORMAL LOW (ref 3.80–5.20)
RDW: 14.1 % (ref 11.5–14.5)
WBC: 11.3 x10 3/mm — ABNORMAL HIGH (ref 3.6–11.0)

## 2013-07-02 LAB — CBC CANCER CENTER
BASOS PCT: 0.3 %
Basophil #: 0 x10 3/mm (ref 0.0–0.1)
EOS PCT: 0 %
Eosinophil #: 0 x10 3/mm (ref 0.0–0.7)
HCT: 36.5 % (ref 35.0–47.0)
HGB: 11.6 g/dL — AB (ref 12.0–16.0)
LYMPHS ABS: 0.5 x10 3/mm — AB (ref 1.0–3.6)
Lymphocyte %: 5.7 %
MCH: 34.2 pg — AB (ref 26.0–34.0)
MCHC: 31.8 g/dL — AB (ref 32.0–36.0)
MCV: 108 fL — AB (ref 80–100)
Monocyte #: 0.9 x10 3/mm (ref 0.2–0.9)
Monocyte %: 10 %
Neutrophil #: 7.2 x10 3/mm — ABNORMAL HIGH (ref 1.4–6.5)
Neutrophil %: 84 %
Platelet: 310 x10 3/mm (ref 150–440)
RBC: 3.39 10*6/uL — ABNORMAL LOW (ref 3.80–5.20)
RDW: 14.1 % (ref 11.5–14.5)
WBC: 8.6 x10 3/mm (ref 3.6–11.0)

## 2013-07-09 ENCOUNTER — Ambulatory Visit: Payer: Self-pay | Admitting: Oncology

## 2013-07-09 LAB — COMPREHENSIVE METABOLIC PANEL
ALT: 20 U/L (ref 12–78)
Albumin: 3.5 g/dL (ref 3.4–5.0)
Alkaline Phosphatase: 84 U/L
Anion Gap: 12 (ref 7–16)
BUN: 11 mg/dL (ref 7–18)
Bilirubin,Total: 0.7 mg/dL (ref 0.2–1.0)
CALCIUM: 9.2 mg/dL (ref 8.5–10.1)
CREATININE: 0.95 mg/dL (ref 0.60–1.30)
Chloride: 101 mmol/L (ref 98–107)
Co2: 26 mmol/L (ref 21–32)
EGFR (African American): >60
EGFR (Non-African Amer.): >60
Glucose: 246 mg/dL — ABNORMAL HIGH (ref 65–99)
OSMOLALITY: 285 (ref 275–301)
Potassium: 3.8 mmol/L (ref 3.5–5.1)
SGOT(AST): 21 U/L (ref 15–37)
Sodium: 139 mmol/L (ref 136–145)
Total Protein: 7.4 g/dL (ref 6.4–8.2)

## 2013-07-09 LAB — CBC CANCER CENTER
Basophil #: 0 x10 3/mm (ref 0.0–0.1)
Basophil %: 0.5 %
EOS PCT: 0.3 %
Eosinophil #: 0 x10 3/mm (ref 0.0–0.7)
HCT: 37 % (ref 35.0–47.0)
HGB: 11.8 g/dL — ABNORMAL LOW (ref 12.0–16.0)
LYMPHS ABS: 0.4 x10 3/mm — AB (ref 1.0–3.6)
Lymphocyte %: 7.5 %
MCH: 34.3 pg — ABNORMAL HIGH (ref 26.0–34.0)
MCHC: 31.9 g/dL — ABNORMAL LOW (ref 32.0–36.0)
MCV: 108 fL — AB (ref 80–100)
MONO ABS: 0.2 x10 3/mm (ref 0.2–0.9)
Monocyte %: 3 %
Neutrophil #: 4.9 x10 3/mm (ref 1.4–6.5)
Neutrophil %: 88.7 %
Platelet: 230 x10 3/mm (ref 150–440)
RBC: 3.44 10*6/uL — ABNORMAL LOW (ref 3.80–5.20)
RDW: 13.8 % (ref 11.5–14.5)
WBC: 5.5 x10 3/mm (ref 3.6–11.0)

## 2013-07-10 LAB — CANCER ANTIGEN 27.29: CA 27.29: 370.7 U/mL — ABNORMAL HIGH (ref 0.0–38.6)

## 2013-07-14 ENCOUNTER — Other Ambulatory Visit (HOSPITAL_COMMUNITY): Payer: Self-pay | Admitting: Family Medicine

## 2013-07-14 DIAGNOSIS — Z139 Encounter for screening, unspecified: Secondary | ICD-10-CM

## 2013-07-16 LAB — CBC CANCER CENTER
Basophil #: 0 x10 3/mm (ref 0.0–0.1)
Basophil %: 0.5 %
Eosinophil #: 0 x10 3/mm (ref 0.0–0.7)
Eosinophil %: 0.9 %
HCT: 37 % (ref 35.0–47.0)
HGB: 11.8 g/dL — AB (ref 12.0–16.0)
LYMPHS ABS: 0.3 x10 3/mm — AB (ref 1.0–3.6)
Lymphocyte %: 7.2 %
MCH: 34.2 pg — AB (ref 26.0–34.0)
MCHC: 31.9 g/dL — AB (ref 32.0–36.0)
MCV: 107 fL — ABNORMAL HIGH (ref 80–100)
MONOS PCT: 8.6 %
Monocyte #: 0.4 x10 3/mm (ref 0.2–0.9)
NEUTROS PCT: 82.8 %
Neutrophil #: 3.5 x10 3/mm (ref 1.4–6.5)
PLATELETS: 70 x10 3/mm — AB (ref 150–440)
RBC: 3.45 10*6/uL — ABNORMAL LOW (ref 3.80–5.20)
RDW: 13.9 % (ref 11.5–14.5)
WBC: 4.3 x10 3/mm (ref 3.6–11.0)

## 2013-07-22 ENCOUNTER — Ambulatory Visit (HOSPITAL_COMMUNITY): Payer: Managed Care, Other (non HMO)

## 2013-07-22 ENCOUNTER — Other Ambulatory Visit (HOSPITAL_COMMUNITY): Payer: Managed Care, Other (non HMO)

## 2013-07-23 LAB — CBC CANCER CENTER
BASOS PCT: 0.4 %
Basophil #: 0 x10 3/mm (ref 0.0–0.1)
EOS ABS: 0.1 x10 3/mm (ref 0.0–0.7)
Eosinophil %: 0.7 %
HCT: 38.3 % (ref 35.0–47.0)
HGB: 12.3 g/dL (ref 12.0–16.0)
Lymphocyte #: 0.9 x10 3/mm — ABNORMAL LOW (ref 1.0–3.6)
Lymphocyte %: 9.2 %
MCH: 34.4 pg — AB (ref 26.0–34.0)
MCHC: 32.1 g/dL (ref 32.0–36.0)
MCV: 107 fL — AB (ref 80–100)
MONO ABS: 1 x10 3/mm — AB (ref 0.2–0.9)
MONOS PCT: 10 %
NEUTROS ABS: 8.2 x10 3/mm — AB (ref 1.4–6.5)
Neutrophil %: 79.7 %
Platelet: 202 x10 3/mm (ref 150–440)
RBC: 3.56 10*6/uL — ABNORMAL LOW (ref 3.80–5.20)
RDW: 14.2 % (ref 11.5–14.5)
WBC: 10.3 x10 3/mm (ref 3.6–11.0)

## 2013-07-30 LAB — CBC CANCER CENTER
BASOS ABS: 0.1 x10 3/mm (ref 0.0–0.1)
BASOS PCT: 0.7 %
EOS PCT: 0.1 %
Eosinophil #: 0 x10 3/mm (ref 0.0–0.7)
HCT: 37.1 % (ref 35.0–47.0)
HGB: 12 g/dL (ref 12.0–16.0)
LYMPHS PCT: 5.4 %
Lymphocyte #: 0.5 x10 3/mm — ABNORMAL LOW (ref 1.0–3.6)
MCH: 34.5 pg — AB (ref 26.0–34.0)
MCHC: 32.4 g/dL (ref 32.0–36.0)
MCV: 107 fL — ABNORMAL HIGH (ref 80–100)
MONO ABS: 0.5 x10 3/mm (ref 0.2–0.9)
Monocyte %: 5.3 %
NEUTROS PCT: 88.5 %
Neutrophil #: 7.9 x10 3/mm — ABNORMAL HIGH (ref 1.4–6.5)
PLATELETS: 317 x10 3/mm (ref 150–440)
RBC: 3.48 10*6/uL — ABNORMAL LOW (ref 3.80–5.20)
RDW: 14.4 % (ref 11.5–14.5)
WBC: 9 x10 3/mm (ref 3.6–11.0)

## 2013-08-06 LAB — CBC CANCER CENTER
BASOS ABS: 0 x10 3/mm (ref 0.0–0.1)
Basophil %: 0.5 %
EOS ABS: 0.1 x10 3/mm (ref 0.0–0.7)
Eosinophil %: 0.7 %
HCT: 37.5 % (ref 35.0–47.0)
HGB: 12.3 g/dL (ref 12.0–16.0)
LYMPHS PCT: 5.4 %
Lymphocyte #: 0.4 x10 3/mm — ABNORMAL LOW (ref 1.0–3.6)
MCH: 34.6 pg — AB (ref 26.0–34.0)
MCHC: 32.8 g/dL (ref 32.0–36.0)
MCV: 106 fL — ABNORMAL HIGH (ref 80–100)
MONOS PCT: 3.4 %
Monocyte #: 0.3 x10 3/mm (ref 0.2–0.9)
Neutrophil #: 7.4 x10 3/mm — ABNORMAL HIGH (ref 1.4–6.5)
Neutrophil %: 90 %
PLATELETS: 251 x10 3/mm (ref 150–440)
RBC: 3.55 10*6/uL — ABNORMAL LOW (ref 3.80–5.20)
RDW: 14.8 % — AB (ref 11.5–14.5)
WBC: 8.3 x10 3/mm (ref 3.6–11.0)

## 2013-08-06 LAB — COMPREHENSIVE METABOLIC PANEL
ALK PHOS: 100 U/L
ANION GAP: 10 (ref 7–16)
Albumin: 3.5 g/dL (ref 3.4–5.0)
BILIRUBIN TOTAL: 0.5 mg/dL (ref 0.2–1.0)
BUN: 17 mg/dL (ref 7–18)
CALCIUM: 9.4 mg/dL (ref 8.5–10.1)
CHLORIDE: 102 mmol/L (ref 98–107)
CREATININE: 1.13 mg/dL (ref 0.60–1.30)
Co2: 29 mmol/L (ref 21–32)
EGFR (African American): 59 — ABNORMAL LOW
EGFR (Non-African Amer.): 51 — ABNORMAL LOW
GLUCOSE: 195 mg/dL — AB (ref 65–99)
OSMOLALITY: 288 (ref 275–301)
POTASSIUM: 3.7 mmol/L (ref 3.5–5.1)
SGOT(AST): 19 U/L (ref 15–37)
SGPT (ALT): 21 U/L (ref 12–78)
Sodium: 141 mmol/L (ref 136–145)
TOTAL PROTEIN: 7.8 g/dL (ref 6.4–8.2)

## 2013-08-07 LAB — CANCER ANTIGEN 27.29: CA 27.29: 400.2 U/mL — AB (ref 0.0–38.6)

## 2013-08-08 ENCOUNTER — Ambulatory Visit: Payer: Self-pay | Admitting: Oncology

## 2013-08-28 ENCOUNTER — Encounter (HOSPITAL_BASED_OUTPATIENT_CLINIC_OR_DEPARTMENT_OTHER): Payer: Medicare Other | Attending: Internal Medicine

## 2013-08-28 DIAGNOSIS — E079 Disorder of thyroid, unspecified: Secondary | ICD-10-CM | POA: Insufficient documentation

## 2013-08-28 DIAGNOSIS — Z79899 Other long term (current) drug therapy: Secondary | ICD-10-CM | POA: Insufficient documentation

## 2013-08-28 DIAGNOSIS — M272 Inflammatory conditions of jaws: Secondary | ICD-10-CM | POA: Insufficient documentation

## 2013-08-28 DIAGNOSIS — I10 Essential (primary) hypertension: Secondary | ICD-10-CM | POA: Insufficient documentation

## 2013-08-28 DIAGNOSIS — Z85118 Personal history of other malignant neoplasm of bronchus and lung: Secondary | ICD-10-CM | POA: Insufficient documentation

## 2013-08-28 DIAGNOSIS — Z853 Personal history of malignant neoplasm of breast: Secondary | ICD-10-CM | POA: Insufficient documentation

## 2013-08-28 DIAGNOSIS — Z9010 Acquired absence of unspecified breast and nipple: Secondary | ICD-10-CM | POA: Insufficient documentation

## 2013-08-29 NOTE — Progress Notes (Signed)
[facility]  NAME:  Toni Harper, Toni Harper NO.:  [id number]  MEDICAL RECORD NO.:  [id number]      DATE OF BIRTH:  Dec 16, 1945  PHYSICIAN:  Ricard Dillon, M.D.      VISIT DATE:                                  OFFICE VISIT   Toni Harper is a 68 year old woman who arrives with orders from her oral surgeon, Dr. Olena Heckle, for hyperbaric oxygen therapy.  As I understand her history, she has had a dental extraction on the lower right mandible roughly a year ago.  Since then, she has had recurrent infections in this area, with recurrent rounds of antibiotics.  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  However, this information is not precisely available and we will need to ask Dr. Olena Heckle to provide more precise information, so we can put this through Insurance (medicare).  PAST MEDICAL HISTORY:  Past medical history is actually complex and includes breast cancer dating back to 1996, hypertension, thyroid disease, she is status post a right mastectomy.  She also has had cancer in her lung in 2010 which I believe was a recurrence of her breast cancer.  MEDICATIONS:  Current medications include atorvastatin 10 daily, hydrochlorothiazide 12.5 daily, levothyroxine 150 daily, potassium chloride 20 b.i.d., prednisone 20 daily, Spiriva 18 mcg daily, and Xeloda 500 two tablets t.i.d.  PHYSICAL EXAMINATION:  VITAL SIGNS:  Temperature is 98.4, pulse 98, respirations 14, blood pressure 120/82. RESPIRATORY:  Clear air entry bilaterally. CARDIAC:  Heart sounds are normal.  There is no murmurs.  She appears to be euvolemic. HEENT:  The site of her dental extraction in lower right jaw is evident. There is no overtly open area here.  There is no external lymphadenopathy in the posterior cervical chains.  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  I will need more documentation of this, however, from Dr. Olena Heckle before we can proceed with his request for hyperbaric oxygen.          ______________________________ Ricard Dillon, M.D.     MGR/MEDQ  D:  08/28/2013  T:  08/29/2013  Job:  [id number]

## 2013-09-04 ENCOUNTER — Other Ambulatory Visit: Payer: Self-pay

## 2013-09-08 ENCOUNTER — Ambulatory Visit: Payer: Self-pay | Admitting: Oncology

## 2013-09-10 ENCOUNTER — Ambulatory Visit: Payer: Self-pay | Admitting: Oncology

## 2013-09-10 ENCOUNTER — Ambulatory Visit: Payer: Managed Care, Other (non HMO) | Admitting: Internal Medicine

## 2013-09-10 LAB — CBC CANCER CENTER
BASOS ABS: 0.1 x10 3/mm (ref 0.0–0.1)
BASOS PCT: 1.2 %
Eosinophil #: 0 x10 3/mm (ref 0.0–0.7)
Eosinophil %: 0.7 %
HCT: 34.2 % — AB (ref 35.0–47.0)
HGB: 11.1 g/dL — ABNORMAL LOW (ref 12.0–16.0)
Lymphocyte #: 0.7 x10 3/mm — ABNORMAL LOW (ref 1.0–3.6)
Lymphocyte %: 11.7 %
MCH: 34.3 pg — ABNORMAL HIGH (ref 26.0–34.0)
MCHC: 32.5 g/dL (ref 32.0–36.0)
MCV: 105 fL — ABNORMAL HIGH (ref 80–100)
MONO ABS: 0.9 x10 3/mm (ref 0.2–0.9)
MONOS PCT: 14.5 %
Neutrophil #: 4.5 x10 3/mm (ref 1.4–6.5)
Neutrophil %: 71.9 %
Platelet: 333 x10 3/mm (ref 150–440)
RBC: 3.24 10*6/uL — AB (ref 3.80–5.20)
RDW: 14.9 % — ABNORMAL HIGH (ref 11.5–14.5)
WBC: 6.2 x10 3/mm (ref 3.6–11.0)

## 2013-09-10 LAB — COMPREHENSIVE METABOLIC PANEL
ALK PHOS: 112 U/L
ALT: 22 U/L (ref 12–78)
Albumin: 3.3 g/dL — ABNORMAL LOW (ref 3.4–5.0)
Anion Gap: 10 (ref 7–16)
BUN: 17 mg/dL (ref 7–18)
Bilirubin,Total: 0.8 mg/dL (ref 0.2–1.0)
CHLORIDE: 101 mmol/L (ref 98–107)
CO2: 27 mmol/L (ref 21–32)
Calcium, Total: 9.7 mg/dL (ref 8.5–10.1)
Creatinine: 0.79 mg/dL (ref 0.60–1.30)
EGFR (Non-African Amer.): >60
Glucose: 142 mg/dL — ABNORMAL HIGH (ref 65–99)
Osmolality: 280 (ref 275–301)
Potassium: 3.5 mmol/L (ref 3.5–5.1)
SGOT(AST): 23 U/L (ref 15–37)
SODIUM: 138 mmol/L (ref 136–145)
Total Protein: 7.7 g/dL (ref 6.4–8.2)

## 2013-09-12 LAB — CANCER ANTIGEN 27.29: CA 27.29: 487.8 U/mL — AB (ref 0.0–38.6)

## 2013-10-08 ENCOUNTER — Ambulatory Visit: Payer: Self-pay | Admitting: Oncology

## 2013-10-13 LAB — CBC CANCER CENTER
Basophil #: 0 x10 3/mm (ref 0.0–0.1)
Basophil %: 0.4 %
EOS ABS: 0 x10 3/mm (ref 0.0–0.7)
Eosinophil %: 0.4 %
HCT: 36.4 % (ref 35.0–47.0)
HGB: 12 g/dL (ref 12.0–16.0)
Lymphocyte #: 0.2 x10 3/mm — ABNORMAL LOW (ref 1.0–3.6)
Lymphocyte %: 13.3 %
MCH: 34.8 pg — ABNORMAL HIGH (ref 26.0–34.0)
MCHC: 33 g/dL (ref 32.0–36.0)
MCV: 106 fL — ABNORMAL HIGH (ref 80–100)
MONOS PCT: 2.2 %
Monocyte #: 0 x10 3/mm — ABNORMAL LOW (ref 0.2–0.9)
NEUTROS ABS: 1.5 x10 3/mm (ref 1.4–6.5)
Neutrophil %: 83.7 %
Platelet: 83 x10 3/mm — ABNORMAL LOW (ref 150–440)
RBC: 3.44 10*6/uL — AB (ref 3.80–5.20)
RDW: 15 % — AB (ref 11.5–14.5)
WBC: 1.8 x10 3/mm — CL (ref 3.6–11.0)

## 2013-10-13 LAB — COMPREHENSIVE METABOLIC PANEL
ALT: 33 U/L (ref 12–78)
ANION GAP: 11 (ref 7–16)
AST: 25 U/L (ref 15–37)
Albumin: 3.4 g/dL (ref 3.4–5.0)
Alkaline Phosphatase: 113 U/L
BUN: 20 mg/dL — ABNORMAL HIGH (ref 7–18)
Bilirubin,Total: 1.2 mg/dL — ABNORMAL HIGH (ref 0.2–1.0)
CREATININE: 1.03 mg/dL (ref 0.60–1.30)
Calcium, Total: 8.9 mg/dL (ref 8.5–10.1)
Chloride: 102 mmol/L (ref 98–107)
Co2: 26 mmol/L (ref 21–32)
EGFR (African American): >60
GFR CALC NON AF AMER: 57 — AB
Glucose: 188 mg/dL — ABNORMAL HIGH (ref 65–99)
OSMOLALITY: 285 (ref 275–301)
Potassium: 4.1 mmol/L (ref 3.5–5.1)
Sodium: 139 mmol/L (ref 136–145)
Total Protein: 7.4 g/dL (ref 6.4–8.2)

## 2013-10-14 LAB — CANCER ANTIGEN 27.29: CA 27.29: 427.3 U/mL — ABNORMAL HIGH (ref 0.0–38.6)

## 2013-11-08 ENCOUNTER — Ambulatory Visit: Payer: Self-pay | Admitting: Oncology

## 2013-11-10 LAB — CBC CANCER CENTER
Basophil #: 0 x10 3/mm (ref 0.0–0.1)
Basophil %: 0.7 %
Eosinophil #: 0 x10 3/mm (ref 0.0–0.7)
Eosinophil %: 0.3 %
HCT: 32.8 % — AB (ref 35.0–47.0)
HGB: 10.7 g/dL — ABNORMAL LOW (ref 12.0–16.0)
Lymphocyte #: 0.7 x10 3/mm — ABNORMAL LOW (ref 1.0–3.6)
Lymphocyte %: 34.1 %
MCH: 36.3 pg — ABNORMAL HIGH (ref 26.0–34.0)
MCHC: 32.5 g/dL (ref 32.0–36.0)
MCV: 112 fL — ABNORMAL HIGH (ref 80–100)
Monocyte #: 0.1 x10 3/mm — ABNORMAL LOW (ref 0.2–0.9)
Monocyte %: 6.3 %
Neutrophil #: 1.2 x10 3/mm — ABNORMAL LOW (ref 1.4–6.5)
Neutrophil %: 58.6 %
Platelet: 104 x10 3/mm — ABNORMAL LOW (ref 150–440)
RBC: 2.94 10*6/uL — ABNORMAL LOW (ref 3.80–5.20)
RDW: 21.3 % — ABNORMAL HIGH (ref 11.5–14.5)
WBC: 2 x10 3/mm — CL (ref 3.6–11.0)

## 2013-11-10 LAB — COMPREHENSIVE METABOLIC PANEL
ALK PHOS: 136 U/L — AB
ALT: 34 U/L
AST: 23 U/L (ref 15–37)
Albumin: 3.2 g/dL — ABNORMAL LOW (ref 3.4–5.0)
Anion Gap: 7 (ref 7–16)
BUN: 15 mg/dL (ref 7–18)
Bilirubin,Total: 1 mg/dL (ref 0.2–1.0)
CALCIUM: 8.9 mg/dL (ref 8.5–10.1)
CHLORIDE: 103 mmol/L (ref 98–107)
CO2: 29 mmol/L (ref 21–32)
Creatinine: 1.01 mg/dL (ref 0.60–1.30)
EGFR (Non-African Amer.): 58 — ABNORMAL LOW
GLUCOSE: 135 mg/dL — AB (ref 65–99)
Osmolality: 280 (ref 275–301)
Potassium: 3.7 mmol/L (ref 3.5–5.1)
Sodium: 139 mmol/L (ref 136–145)
Total Protein: 7 g/dL (ref 6.4–8.2)

## 2013-11-11 LAB — CA 125: CA 125: 69.4 U/mL — AB (ref 0.0–34.0)

## 2013-11-14 LAB — CBC CANCER CENTER
Basophil #: 0 x10 3/mm (ref 0.0–0.1)
Basophil %: 1.1 %
Eosinophil #: 0 x10 3/mm (ref 0.0–0.7)
Eosinophil %: 0.3 %
HCT: 32.8 % — ABNORMAL LOW (ref 35.0–47.0)
HGB: 10.8 g/dL — ABNORMAL LOW (ref 12.0–16.0)
Lymphocyte #: 0.7 x10 3/mm — ABNORMAL LOW (ref 1.0–3.6)
Lymphocyte %: 40.2 %
MCH: 37.2 pg — AB (ref 26.0–34.0)
MCHC: 32.9 g/dL (ref 32.0–36.0)
MCV: 113 fL — AB (ref 80–100)
Monocyte #: 0.2 x10 3/mm (ref 0.2–0.9)
Monocyte %: 12.6 %
Neutrophil #: 0.8 x10 3/mm — ABNORMAL LOW (ref 1.4–6.5)
Neutrophil %: 45.8 %
PLATELETS: 132 x10 3/mm — AB (ref 150–440)
RBC: 2.9 10*6/uL — ABNORMAL LOW (ref 3.80–5.20)
RDW: 21.9 % — ABNORMAL HIGH (ref 11.5–14.5)
WBC: 1.8 x10 3/mm — AB (ref 3.6–11.0)

## 2013-11-18 LAB — CANCER ANTIGEN 27.29: CA 27.29: 417.2 U/mL — AB (ref 0.0–38.6)

## 2013-11-19 LAB — COMPREHENSIVE METABOLIC PANEL
Albumin: 3.2 g/dL — ABNORMAL LOW (ref 3.4–5.0)
Alkaline Phosphatase: 131 U/L — ABNORMAL HIGH
Anion Gap: 17 — ABNORMAL HIGH (ref 7–16)
BUN: 20 mg/dL — ABNORMAL HIGH (ref 7–18)
Bilirubin,Total: 0.6 mg/dL (ref 0.2–1.0)
CALCIUM: 8.5 mg/dL (ref 8.5–10.1)
CREATININE: 1.28 mg/dL (ref 0.60–1.30)
Chloride: 100 mmol/L (ref 98–107)
Co2: 20 mmol/L — ABNORMAL LOW (ref 21–32)
EGFR (Non-African Amer.): 44 — ABNORMAL LOW
GFR CALC AF AMER: 51 — AB
GLUCOSE: 289 mg/dL — AB (ref 65–99)
Osmolality: 287 (ref 275–301)
POTASSIUM: 3.6 mmol/L (ref 3.5–5.1)
SGOT(AST): 23 U/L (ref 15–37)
SGPT (ALT): 30 U/L
SODIUM: 137 mmol/L (ref 136–145)
Total Protein: 7.2 g/dL (ref 6.4–8.2)

## 2013-11-19 LAB — CBC CANCER CENTER
BASOS PCT: 0.4 %
Basophil #: 0 x10 3/mm (ref 0.0–0.1)
EOS ABS: 0 x10 3/mm (ref 0.0–0.7)
Eosinophil %: 0 %
HCT: 34.1 % — ABNORMAL LOW (ref 35.0–47.0)
HGB: 11.1 g/dL — ABNORMAL LOW (ref 12.0–16.0)
LYMPHS PCT: 8.6 %
Lymphocyte #: 0.2 x10 3/mm — ABNORMAL LOW (ref 1.0–3.6)
MCH: 37.7 pg — ABNORMAL HIGH (ref 26.0–34.0)
MCHC: 32.6 g/dL (ref 32.0–36.0)
MCV: 116 fL — AB (ref 80–100)
MONO ABS: 0.1 x10 3/mm — AB (ref 0.2–0.9)
MONOS PCT: 4.8 %
Neutrophil #: 2.4 x10 3/mm (ref 1.4–6.5)
Neutrophil %: 86.2 %
PLATELETS: 228 x10 3/mm (ref 150–440)
RBC: 2.95 10*6/uL — AB (ref 3.80–5.20)
RDW: 23.3 % — ABNORMAL HIGH (ref 11.5–14.5)
WBC: 2.8 x10 3/mm — AB (ref 3.6–11.0)

## 2013-11-24 LAB — CULTURE, BLOOD (SINGLE)

## 2013-12-09 ENCOUNTER — Ambulatory Visit: Payer: Self-pay | Admitting: Oncology

## 2014-01-01 LAB — CBC CANCER CENTER
BASOS ABS: 0 x10 3/mm (ref 0.0–0.1)
Basophil %: 0.3 %
EOS ABS: 0 x10 3/mm (ref 0.0–0.7)
EOS PCT: 0 %
HCT: 31.4 % — ABNORMAL LOW (ref 35.0–47.0)
HGB: 10.4 g/dL — AB (ref 12.0–16.0)
LYMPHS ABS: 0.2 x10 3/mm — AB (ref 1.0–3.6)
Lymphocyte %: 9.3 %
MCH: 40.9 pg — AB (ref 26.0–34.0)
MCHC: 33.1 g/dL (ref 32.0–36.0)
MCV: 124 fL — ABNORMAL HIGH (ref 80–100)
MONOS PCT: 2.8 %
Monocyte #: 0.1 x10 3/mm — ABNORMAL LOW (ref 0.2–0.9)
NEUTROS PCT: 87.6 %
Neutrophil #: 2.2 x10 3/mm (ref 1.4–6.5)
Platelet: 120 x10 3/mm — ABNORMAL LOW (ref 150–440)
RBC: 2.54 10*6/uL — AB (ref 3.80–5.20)
RDW: 16.9 % — AB (ref 11.5–14.5)
WBC: 2.5 x10 3/mm — AB (ref 3.6–11.0)

## 2014-01-01 LAB — COMPREHENSIVE METABOLIC PANEL
ALK PHOS: 126 U/L — AB
AST: 40 U/L — AB (ref 15–37)
Albumin: 3.5 g/dL (ref 3.4–5.0)
Anion Gap: 10 (ref 7–16)
BUN: 14 mg/dL (ref 7–18)
Bilirubin,Total: 0.8 mg/dL (ref 0.2–1.0)
CALCIUM: 8.5 mg/dL (ref 8.5–10.1)
CREATININE: 1.06 mg/dL (ref 0.60–1.30)
Chloride: 105 mmol/L (ref 98–107)
Co2: 24 mmol/L (ref 21–32)
EGFR (African American): >60
GFR CALC NON AF AMER: 55 — AB
Glucose: 221 mg/dL — ABNORMAL HIGH (ref 65–99)
OSMOLALITY: 285 (ref 275–301)
Potassium: 3.6 mmol/L (ref 3.5–5.1)
SGPT (ALT): 23 U/L
Sodium: 139 mmol/L (ref 136–145)
TOTAL PROTEIN: 7.6 g/dL (ref 6.4–8.2)

## 2014-01-02 LAB — CANCER ANTIGEN 27.29: CA 27.29: 406.6 U/mL — AB (ref 0.0–38.6)

## 2014-01-07 ENCOUNTER — Ambulatory Visit: Payer: Self-pay | Admitting: Oncology

## 2014-01-08 ENCOUNTER — Ambulatory Visit: Payer: Self-pay | Admitting: Oncology

## 2014-01-10 ENCOUNTER — Ambulatory Visit: Payer: Self-pay | Admitting: Oncology

## 2014-01-21 ENCOUNTER — Ambulatory Visit: Payer: Self-pay | Admitting: Oncology

## 2014-01-26 LAB — COMPREHENSIVE METABOLIC PANEL
AST: 24 U/L (ref 15–37)
Albumin: 3.7 g/dL (ref 3.4–5.0)
Alkaline Phosphatase: 118 U/L — ABNORMAL HIGH
Anion Gap: 12 (ref 7–16)
BILIRUBIN TOTAL: 1.2 mg/dL — AB (ref 0.2–1.0)
BUN: 14 mg/dL (ref 7–18)
CALCIUM: 9.3 mg/dL (ref 8.5–10.1)
CHLORIDE: 101 mmol/L (ref 98–107)
CO2: 27 mmol/L (ref 21–32)
CREATININE: 0.92 mg/dL (ref 0.60–1.30)
EGFR (African American): >60
EGFR (Non-African Amer.): >60
Glucose: 140 mg/dL — ABNORMAL HIGH (ref 65–99)
Osmolality: 282 (ref 275–301)
POTASSIUM: 3.3 mmol/L — AB (ref 3.5–5.1)
SGPT (ALT): 25 U/L
Sodium: 140 mmol/L (ref 136–145)
TOTAL PROTEIN: 7.6 g/dL (ref 6.4–8.2)

## 2014-01-26 LAB — CBC CANCER CENTER
BASOS PCT: 0.3 %
Basophil #: 0 x10 3/mm (ref 0.0–0.1)
Eosinophil #: 0 x10 3/mm (ref 0.0–0.7)
Eosinophil %: 0.5 %
HCT: 34.8 % — AB (ref 35.0–47.0)
HGB: 11.3 g/dL — ABNORMAL LOW (ref 12.0–16.0)
LYMPHS PCT: 9.7 %
Lymphocyte #: 0.3 x10 3/mm — ABNORMAL LOW (ref 1.0–3.6)
MCH: 41 pg — ABNORMAL HIGH (ref 26.0–34.0)
MCHC: 32.5 g/dL (ref 32.0–36.0)
MCV: 126 fL — ABNORMAL HIGH (ref 80–100)
Monocyte #: 0.1 x10 3/mm — ABNORMAL LOW (ref 0.2–0.9)
Monocyte %: 4.6 %
NEUTROS ABS: 2.7 x10 3/mm (ref 1.4–6.5)
Neutrophil %: 84.9 %
Platelet: 167 x10 3/mm (ref 150–440)
RBC: 2.76 10*6/uL — ABNORMAL LOW (ref 3.80–5.20)
RDW: 17 % — ABNORMAL HIGH (ref 11.5–14.5)
WBC: 3.2 x10 3/mm — ABNORMAL LOW (ref 3.6–11.0)

## 2014-01-27 LAB — CANCER ANTIGEN 27.29: CA 27.29: 492.8 U/mL — AB (ref 0.0–38.6)

## 2014-02-08 ENCOUNTER — Ambulatory Visit: Payer: Self-pay | Admitting: Oncology

## 2014-02-09 ENCOUNTER — Encounter: Payer: Self-pay | Admitting: General Surgery

## 2014-02-12 LAB — COMPREHENSIVE METABOLIC PANEL
ALK PHOS: 105 U/L
ANION GAP: 10 (ref 7–16)
Albumin: 3.5 g/dL (ref 3.4–5.0)
BUN: 15 mg/dL (ref 7–18)
Bilirubin,Total: 0.8 mg/dL (ref 0.2–1.0)
CHLORIDE: 106 mmol/L (ref 98–107)
CREATININE: 0.91 mg/dL (ref 0.60–1.30)
Calcium, Total: 8.6 mg/dL (ref 8.5–10.1)
Co2: 24 mmol/L (ref 21–32)
EGFR (African American): >60
Glucose: 145 mg/dL — ABNORMAL HIGH (ref 65–99)
OSMOLALITY: 283 (ref 275–301)
Potassium: 4.4 mmol/L (ref 3.5–5.1)
SGOT(AST): 36 U/L (ref 15–37)
SGPT (ALT): 37 U/L
Sodium: 140 mmol/L (ref 136–145)
Total Protein: 7.6 g/dL (ref 6.4–8.2)

## 2014-02-12 LAB — CBC CANCER CENTER
BASOS PCT: 0.2 %
Basophil #: 0 x10 3/mm (ref 0.0–0.1)
EOS ABS: 0 x10 3/mm (ref 0.0–0.7)
Eosinophil %: 0.3 %
HCT: 32.8 % — AB (ref 35.0–47.0)
HGB: 10.8 g/dL — ABNORMAL LOW (ref 12.0–16.0)
LYMPHS ABS: 0.1 x10 3/mm — AB (ref 1.0–3.6)
Lymphocyte %: 4.2 %
MCH: 42.9 pg — ABNORMAL HIGH (ref 26.0–34.0)
MCHC: 33 g/dL (ref 32.0–36.0)
MCV: 130 fL — AB (ref 80–100)
Monocyte #: 0.1 x10 3/mm — ABNORMAL LOW (ref 0.2–0.9)
Monocyte %: 2.7 %
NEUTROS ABS: 2.3 x10 3/mm (ref 1.4–6.5)
NEUTROS PCT: 92.6 %
PLATELETS: 223 x10 3/mm (ref 150–440)
RBC: 2.52 10*6/uL — ABNORMAL LOW (ref 3.80–5.20)
RDW: 18.5 % — ABNORMAL HIGH (ref 11.5–14.5)
WBC: 2.5 x10 3/mm — ABNORMAL LOW (ref 3.6–11.0)

## 2014-03-10 ENCOUNTER — Ambulatory Visit: Payer: Self-pay | Admitting: Oncology

## 2014-03-12 LAB — CBC CANCER CENTER
Basophil #: 0 x10 3/mm (ref 0.0–0.1)
Basophil %: 0.3 %
Eosinophil #: 0 x10 3/mm (ref 0.0–0.7)
Eosinophil %: 0.5 %
HCT: 35.7 % (ref 35.0–47.0)
HGB: 11.7 g/dL — ABNORMAL LOW (ref 12.0–16.0)
LYMPHS PCT: 1.6 %
Lymphocyte #: 0.1 x10 3/mm — ABNORMAL LOW (ref 1.0–3.6)
MCH: 41.2 pg — AB (ref 26.0–34.0)
MCHC: 32.8 g/dL (ref 32.0–36.0)
MCV: 126 fL — AB (ref 80–100)
Monocyte #: 0.8 x10 3/mm (ref 0.2–0.9)
Monocyte %: 9.8 %
NEUTROS PCT: 87.8 %
Neutrophil #: 7.1 x10 3/mm — ABNORMAL HIGH (ref 1.4–6.5)
Platelet: 309 x10 3/mm (ref 150–440)
RBC: 2.85 10*6/uL — ABNORMAL LOW (ref 3.80–5.20)
RDW: 15 % — AB (ref 11.5–14.5)
WBC: 8.1 x10 3/mm (ref 3.6–11.0)

## 2014-03-12 LAB — COMPREHENSIVE METABOLIC PANEL
ALK PHOS: 110 U/L
Albumin: 3.6 g/dL (ref 3.4–5.0)
Anion Gap: 9 (ref 7–16)
BILIRUBIN TOTAL: 1.1 mg/dL — AB (ref 0.2–1.0)
BUN: 21 mg/dL — ABNORMAL HIGH (ref 7–18)
Calcium, Total: 8.5 mg/dL (ref 8.5–10.1)
Chloride: 106 mmol/L (ref 98–107)
Co2: 26 mmol/L (ref 21–32)
Creatinine: 1.04 mg/dL (ref 0.60–1.30)
EGFR (African American): >60
GFR CALC NON AF AMER: 57 — AB
Glucose: 183 mg/dL — ABNORMAL HIGH (ref 65–99)
Osmolality: 289 (ref 275–301)
Potassium: 5 mmol/L (ref 3.5–5.1)
SGOT(AST): 63 U/L — ABNORMAL HIGH (ref 15–37)
SGPT (ALT): 64 U/L — ABNORMAL HIGH
SODIUM: 141 mmol/L (ref 136–145)
TOTAL PROTEIN: 7.5 g/dL (ref 6.4–8.2)

## 2014-03-13 LAB — CANCER ANTIGEN 27.29: CA 27.29: 730.9 U/mL — ABNORMAL HIGH (ref 0.0–38.6)

## 2014-04-01 LAB — CBC CANCER CENTER
BASOS PCT: 0.4 %
Basophil #: 0 x10 3/mm (ref 0.0–0.1)
Eosinophil #: 0 x10 3/mm (ref 0.0–0.7)
Eosinophil %: 0.2 %
HCT: 38.1 % (ref 35.0–47.0)
HGB: 12.5 g/dL (ref 12.0–16.0)
LYMPHS ABS: 0.2 x10 3/mm — AB (ref 1.0–3.6)
Lymphocyte %: 5.8 %
MCH: 41 pg — AB (ref 26.0–34.0)
MCHC: 32.7 g/dL (ref 32.0–36.0)
MCV: 126 fL — AB (ref 80–100)
Monocyte #: 0.3 x10 3/mm (ref 0.2–0.9)
Monocyte %: 7.2 %
NEUTROS PCT: 86.4 %
Neutrophil #: 3.6 x10 3/mm (ref 1.4–6.5)
PLATELETS: 126 x10 3/mm — AB (ref 150–440)
RBC: 3.04 10*6/uL — ABNORMAL LOW (ref 3.80–5.20)
RDW: 14.3 % (ref 11.5–14.5)
WBC: 4.2 x10 3/mm (ref 3.6–11.0)

## 2014-04-01 LAB — COMPREHENSIVE METABOLIC PANEL
ALK PHOS: 144 U/L — AB
ANION GAP: 11 (ref 7–16)
Albumin: 3.7 g/dL (ref 3.4–5.0)
BILIRUBIN TOTAL: 1 mg/dL (ref 0.2–1.0)
BUN: 28 mg/dL — ABNORMAL HIGH (ref 7–18)
CALCIUM: 8.6 mg/dL (ref 8.5–10.1)
Chloride: 106 mmol/L (ref 98–107)
Co2: 25 mmol/L (ref 21–32)
Creatinine: 0.94 mg/dL (ref 0.60–1.30)
EGFR (African American): >60
Glucose: 164 mg/dL — ABNORMAL HIGH (ref 65–99)
Osmolality: 292 (ref 275–301)
Potassium: 3.7 mmol/L (ref 3.5–5.1)
SGOT(AST): 84 U/L — ABNORMAL HIGH (ref 15–37)
SGPT (ALT): 197 U/L — ABNORMAL HIGH
SODIUM: 142 mmol/L (ref 136–145)
TOTAL PROTEIN: 7 g/dL (ref 6.4–8.2)

## 2014-04-02 LAB — CANCER ANTIGEN 27.29: CA 27.29: 675 U/mL — AB (ref 0.0–38.6)

## 2014-04-10 ENCOUNTER — Ambulatory Visit: Payer: Self-pay | Admitting: Oncology

## 2014-05-06 LAB — COMPREHENSIVE METABOLIC PANEL
ALBUMIN: 3.3 g/dL — AB (ref 3.4–5.0)
ALK PHOS: 193 U/L — AB (ref 46–116)
Anion Gap: 12 (ref 7–16)
BUN: 19 mg/dL — ABNORMAL HIGH (ref 7–18)
Bilirubin,Total: 0.7 mg/dL (ref 0.2–1.0)
CALCIUM: 8.4 mg/dL — AB (ref 8.5–10.1)
Chloride: 104 mmol/L (ref 98–107)
Co2: 25 mmol/L (ref 21–32)
Creatinine: 1.03 mg/dL (ref 0.60–1.30)
EGFR (African American): >60
GFR CALC NON AF AMER: 57 — AB
GLUCOSE: 255 mg/dL — AB (ref 65–99)
Osmolality: 292 (ref 275–301)
Potassium: 4.1 mmol/L (ref 3.5–5.1)
SGOT(AST): 43 U/L — ABNORMAL HIGH (ref 15–37)
SGPT (ALT): 71 U/L — ABNORMAL HIGH (ref 14–63)
Sodium: 141 mmol/L (ref 136–145)
Total Protein: 7 g/dL (ref 6.4–8.2)

## 2014-05-06 LAB — CBC CANCER CENTER
Basophil #: 0 x10 3/mm (ref 0.0–0.1)
Basophil %: 0.5 %
EOS PCT: 0.2 %
Eosinophil #: 0 x10 3/mm (ref 0.0–0.7)
HCT: 36.9 % (ref 35.0–47.0)
HGB: 12.4 g/dL (ref 12.0–16.0)
Lymphocyte #: 0.2 x10 3/mm — ABNORMAL LOW (ref 1.0–3.6)
Lymphocyte %: 6.4 %
MCH: 41.2 pg — ABNORMAL HIGH (ref 26.0–34.0)
MCHC: 33.6 g/dL (ref 32.0–36.0)
MCV: 123 fL — ABNORMAL HIGH (ref 80–100)
Monocyte #: 0.1 x10 3/mm — ABNORMAL LOW (ref 0.2–0.9)
Monocyte %: 3.3 %
NEUTROS PCT: 89.6 %
Neutrophil #: 2.3 x10 3/mm (ref 1.4–6.5)
Platelet: 81 x10 3/mm — ABNORMAL LOW (ref 150–440)
RBC: 3.01 10*6/uL — AB (ref 3.80–5.20)
RDW: 16 % — ABNORMAL HIGH (ref 11.5–14.5)
WBC: 2.5 x10 3/mm — ABNORMAL LOW (ref 3.6–11.0)

## 2014-05-07 LAB — CANCER ANTIGEN 27.29: CA 27.29: 1121.6 U/mL — AB (ref 0.0–38.6)

## 2014-05-11 ENCOUNTER — Ambulatory Visit: Payer: Self-pay | Admitting: Oncology

## 2014-06-09 DEATH — deceased

## 2014-07-31 NOTE — Op Note (Signed)
PATIENT NAME:  [name], Toni Harper MR#:  [id number] DATE OF BIRTH:  [birth year]  DATE OF PROCEDURE:  12/06/2012  PREOPERATIVE DIAGNOSIS: Carcinoma of the lung and breast.   POSTOPERATIVE DIAGNOSIS: Carcinoma of the lung and breast.   OPERATION: Insertion of venous access port with ultrasound and fluoroscopic guidance.   SURGEON: Mckinley Jewel, M.D.   ANESTHESIA: Local with 0.5% Marcaine and 1% Xylocaine.   COMPLICATIONS: None.   ESTIMATED BLOOD LOSS: Approximately 50 mL.  DESCRIPTION OF PROCEDURE: The patient was placed in the supine position on the operating table in slight Trendelenburg. The right upper neck and chest area were prepped and draped out as a sterile field. Ultrasound probe was used to locate the subclavian vein underneath the lateral end of the clavicle. A local anesthetic was instilled and a small 1 cm incision was made. Through this, the needle was positioned into the subclavian vein with free withdrawal of blood. A guidewire was passed. With fluoroscopy, it was noted that the guidewire would only go up to the junction of the subclavian vein of the internal jugular area and could not negotiate this any further. Multiple attempts were unsuccessful. The patient is known to have a mediastinal mass which was likely causing some obstructive component to the veins. In view of this, it was decided to leave the catheter in the distal subclavian vein. After the dilator and introducer were placed, the catheter was then fastened into position at this level.. After this the subcutaneous pocket was made over the second costal cartilage, after instillation of local anesthetic. A transverse skin incision was made. The pocket was created with cautery. A few bleeders were ligated with 3-0 Vicryl. The catheter was tunneled through to this site. It was cut to appropriate length and affixed to the prefilled port. The port was placed in the pocket and anchored with 3 stitches of 2-0 Prolene and then flushed  through with heparinized saline. Repeat fluoroscopy showed that the catheter was still in good position in the subclavian s junction of the internal jugular. The subcutaneous tissue was closed with 3-0 Vicryl and skin with subcuticular 4-0 Vicryl, reinforced with Steri-Strips. A dry sterile dressing was placed. The patient tolerated the procedure well. There were no immediate problems that were encountered. She was returned to the recovery room in stable condition.  ____________________________ S.Robinette Haines, MD sgs:sb D: 12/06/2012 16:31:00 ET T: 12/06/2012 16:50:37 ET JOB#: [id number]  cc: S.G. Jamal Collin, MD, <Dictator> [facility] Robinette Haines MD ELECTRONICALLY SIGNED 12/08/2012 10:29

## 2014-08-01 NOTE — Op Note (Signed)
PATIENT NAME:  Toni Harper, Toni Harper MR#:  [id number] DATE OF BIRTH:  [birth year]  DATE OF PROCEDURE:  06/10/2013  PREOPERATIVE DIAGNOSES: Complication of Infuse-a-Port with inability to aspirate blood.   POSTOPERATIVE DIAGNOSES: Complication of Infuse-a-Port with inability to aspirate blood.   PROCEDURE PERFORMED: Contrast injection of right subclavian Infuse-a-Port.   SURGEON: Hortencia Pilar, M.D.   FLUOROSCOPY TIME: Less than 1 minute.   CONTRAST USED: 5 mL.   INDICATIONS: [name] is a 69 year old woman who was sent from the cancer center for evaluation of her port secondary to inability to aspirate blood. Risks and benefits were reviewed. The patient has agreed to proceed.   DESCRIPTION OF PROCEDURE: The patient was taken to special procedures and placed in the supine position. Her port has been has been accessed at the cancer center. A small injection of contrast is utilized under fluoroscopy. Subsequently, the port is flushed with sterile saline and a sterile dressing applied over the needle and she is returned to the cancer center.   INTERPRETATION: The port does appear to have pulled back from its likely original spot. It is now sitting within the proximal subclavian. It is intravascular and there is no extravasation of contrast. 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 The port is otherwise intact and in functional condition.   These findings were conveyed to Dr. Oliva Bustard by phone.    ____________________________ Katha Cabal, MD ggs:np D: 06/11/2013 10:38:41 ET T: 06/11/2013 15:15:17 ET JOB#: [id number]  cc: Katha Cabal, MD, <Dictator> Katha Cabal MD ELECTRONICALLY SIGNED 07/01/2013 18:43

## 2014-08-01 NOTE — Consult Note (Signed)
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  Presently on IBRANCE and letrozole Patient was admitted in the hospital after thoracentesis was done yesterday.  900 ml of fluid was removed.  According to patient had some relief in shortness of breath.  She developed pneumothorax which increase in size of a repeat chest x-ray 2 hours after the procedure.  So patient was admitted in the hospital for observation. This morning patient is feeling much better.  Chest x-ray which was done has been reviewed shows decrease in the size of pneumothorax Patient's past history social and family history has been reviewed On examination: Vital signs are stable Lungs: Diminished   air  on  entry on the both sides. Heart: Tachycardia Abdomen soft Lower extremity trace edema Prominent collateral veins on the chest Nonhealing wound   - plan:as x-rays improving and patient is not any acute distress depending on   Dr. Soyla Dryer opinion  ,patient can be discharged and I will closely follow this patient with chest x-ray patient had all other appointments set up in cancer clinic with repeat CT scan .  Tumor markers are high but stable.   Electronic Signatures: Jobe Gibbon (MD)  (Signed 01-Oct-15 08:55)  Authored: Details   Last Updated: 01-Oct-15 08:55 by Jobe Gibbon (MD)

## 2015-08-01 IMAGING — CT NM PET TUM IMG RESTAG (PS) SKULL BASE T - THIGH
1 of 9 series · 2 of 25 positions shown · non-contrast
Comparison: PET-CT 03/20/2012, bone scan 11/28/2010.

CLINICAL DATA: Subsequent treatment strategy for breast cancer.
Known metastatic sclerotic osseous lesions and thoracic
lymphadenopathy.

EXAM:
NUCLEAR MEDICINE PET SKULL BASE TO THIGH
FASTING BLOOD GLUCOSE:  Value: 114mg/dl
TECHNIQUE: 12.5 mCi F-18 FDG was injected intravenously. CT data was obtained
and used for attenuation correction and anatomic localization only.
(This was not acquired as a diagnostic CT examination.) Additional
exam technical data entered on technologist worksheet.

[Series 3: ct wb 3.0 b30f · axial · 3.0mm · 0.98mm/px · z∈[-620,-496]mm · 2 of 435 slices shown]
[im 373/435  brain]
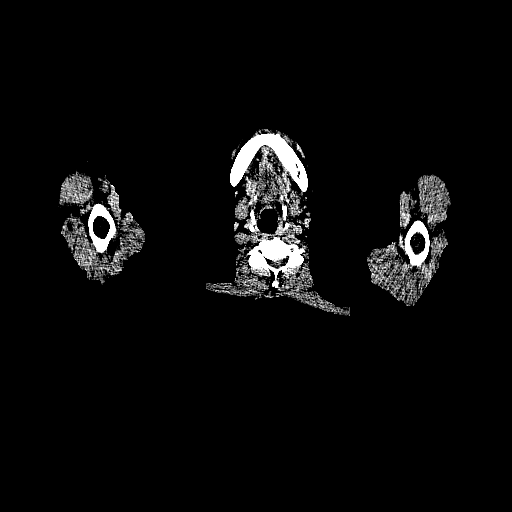
[im 435/435  brain]
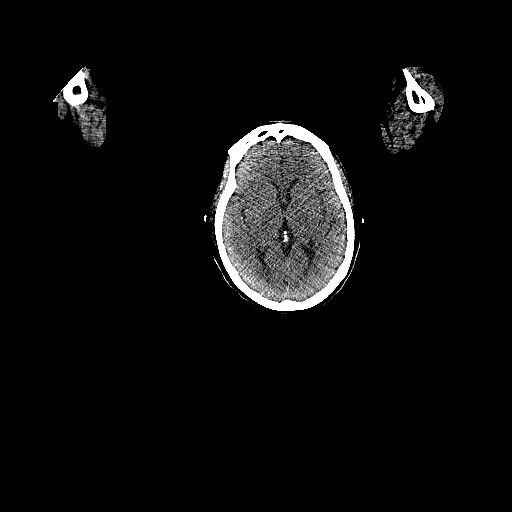

[2 of 25 positions shown; findings below may reference images not displayed]

Most recent
diagnostic chest CT 12/02/2012. No previous diagnostic CT abdomen or
pelvis is available for comparison.
FINDINGS: NECK

No hypermetabolic lymph nodes in the neck.

CHEST

Evidence of right mastectomy with implant reconstruction again
noted. No abnormal FDG uptake is identified in the right mastectomy
bed. Diffuse left hemithoracic volume loss reidentified with pleural
thickening and moderate pleural effusions, with areas of adjacent
pulmonary parenchymal masslike consolidation in a configuration in
some locations that may suggest rounded atelectasis. Previously seen
FDG avid left-sided pulmonary parenchymal nodules are no longer
specifically FDG avid above background level. A new right-sided
Port-A-Cath is in place with tip at the SVC/brachiocephalic
junction. The superior vena cava is atretic and patency is not
specifically evaluated on the current exam.

Abnormal FDG uptake is reidentified within pretracheal
lymphadenopathy, SUV maximum 4.4.

ABDOMEN/PELVIS

There has been interval increase in size and number of multiple
areas of abnormal appearing FDG uptake throughout the liver above
that of background level. For example, SUV maximum of a dominant
area of uptake in the left hepatic lobe measures 5.7. These
correspond to ill-defined hypodense lesions on the accompanying
nondiagnostic CT images.

SKELETON

Numerous sclerotic osseous metastatic lesions throughout the axial
skeleton are reidentified without abnormal accompanying FDG uptake.
IMPRESSION: Mixed response, with decreased FDG avidity of pulmonary nodules but
increased size and number of FDG avid presumed hepatic metastases.

Abnormal FDG uptake within pretracheal lymphadenopathy again noted.

Innumerable sclerotic osseous metastatic lesions reidentified
without accompanying FDG uptake.

Right-sided Port-A-Cath placed with tip at the brachiocephalic/ SVC
junction. The superior vena cava appears somewhat atretic and
patency is not specifically evaluated on this exam.

## 2017-10-02 ENCOUNTER — Encounter: Payer: Self-pay | Admitting: Gastroenterology
